# Patient Record
Sex: Female | Born: 1940
Health system: Southern US, Community
[De-identification: ages and names within clinical notes are randomized; demographics above are authoritative.]

## PROBLEM LIST (undated history)

## (undated) DIAGNOSIS — M1990 Unspecified osteoarthritis, unspecified site: Secondary | ICD-10-CM

## (undated) HISTORY — PX: FOOT SURGERY: SHX648

## (undated) HISTORY — PX: KIDNEY SURGERY: SHX687

## (undated) HISTORY — PX: BLEPHAROPLASTY: SUR158

## (undated) HISTORY — PX: BREAST CYST ASPIRATION: SHX578

---

## 2004-04-17 ENCOUNTER — Ambulatory Visit: Payer: Self-pay | Admitting: Internal Medicine

## 2005-01-21 ENCOUNTER — Ambulatory Visit: Payer: Self-pay | Admitting: Orthopedic Surgery

## 2005-04-18 ENCOUNTER — Ambulatory Visit: Payer: Self-pay | Admitting: Internal Medicine

## 2006-04-29 ENCOUNTER — Ambulatory Visit: Payer: Self-pay | Admitting: Family Medicine

## 2007-05-05 ENCOUNTER — Ambulatory Visit: Payer: Self-pay | Admitting: Family Medicine

## 2008-02-11 ENCOUNTER — Ambulatory Visit: Payer: Self-pay | Admitting: Pulmonary Disease

## 2008-02-11 DIAGNOSIS — R059 Cough, unspecified: Secondary | ICD-10-CM | POA: Insufficient documentation

## 2008-02-11 DIAGNOSIS — R05 Cough: Secondary | ICD-10-CM

## 2008-02-11 DIAGNOSIS — J309 Allergic rhinitis, unspecified: Secondary | ICD-10-CM | POA: Insufficient documentation

## 2008-02-18 ENCOUNTER — Telehealth (INDEPENDENT_AMBULATORY_CARE_PROVIDER_SITE_OTHER): Payer: Self-pay | Admitting: *Deleted

## 2008-05-08 ENCOUNTER — Ambulatory Visit: Payer: Self-pay | Admitting: Family Medicine

## 2009-05-15 ENCOUNTER — Ambulatory Visit: Payer: Self-pay | Admitting: Family Medicine

## 2010-05-16 ENCOUNTER — Ambulatory Visit: Payer: Self-pay | Admitting: Family Medicine

## 2010-10-31 ENCOUNTER — Ambulatory Visit: Payer: Self-pay | Admitting: Gastroenterology

## 2011-05-20 ENCOUNTER — Ambulatory Visit: Payer: Self-pay | Admitting: Family Medicine

## 2011-08-13 ENCOUNTER — Ambulatory Visit: Payer: Self-pay | Admitting: Family Medicine

## 2011-12-25 ENCOUNTER — Emergency Department: Payer: Self-pay | Admitting: Emergency Medicine

## 2011-12-25 LAB — COMPREHENSIVE METABOLIC PANEL
Alkaline Phosphatase: 104 U/L (ref 50–136)
BUN: 16 mg/dL (ref 7–18)
Calcium, Total: 9.1 mg/dL (ref 8.5–10.1)
Chloride: 102 mmol/L (ref 98–107)
Co2: 29 mmol/L (ref 21–32)
EGFR (African American): >60
EGFR (Non-African Amer.): >60
Osmolality: 275 (ref 275–301)
Potassium: 3.8 mmol/L (ref 3.5–5.1)
SGPT (ALT): 24 U/L
Sodium: 137 mmol/L (ref 136–145)
Total Protein: 8 g/dL (ref 6.4–8.2)

## 2011-12-25 LAB — URINALYSIS, COMPLETE
Bilirubin,UR: NEGATIVE
Glucose,UR: NEGATIVE mg/dL (ref 0–75)
Ketone: NEGATIVE
Nitrite: NEGATIVE
Ph: 7 (ref 4.5–8.0)
Protein: NEGATIVE
Squamous Epithelial: <1
WBC UR: 2 /HPF (ref 0–5)

## 2011-12-25 LAB — CBC
HGB: 14.6 g/dL (ref 12.0–16.0)
MCH: 30.6 pg (ref 26.0–34.0)
MCHC: 33.2 g/dL (ref 32.0–36.0)
Platelet: 196 10*3/uL (ref 150–440)
WBC: 7.7 10*3/uL (ref 3.6–11.0)

## 2011-12-25 LAB — LIPASE, BLOOD: Lipase: 130 U/L (ref 73–393)

## 2012-04-21 ENCOUNTER — Ambulatory Visit: Payer: Self-pay | Admitting: Podiatry

## 2012-05-20 ENCOUNTER — Ambulatory Visit: Payer: Self-pay | Admitting: Family Medicine

## 2013-05-24 ENCOUNTER — Ambulatory Visit: Payer: Self-pay | Admitting: Family Medicine

## 2013-06-13 ENCOUNTER — Ambulatory Visit: Payer: Self-pay | Admitting: Family Medicine

## 2013-06-16 ENCOUNTER — Ambulatory Visit: Payer: Self-pay | Admitting: Family Medicine

## 2014-05-29 ENCOUNTER — Ambulatory Visit: Payer: Self-pay | Admitting: Family Medicine

## 2014-08-17 DIAGNOSIS — H02402 Unspecified ptosis of left eyelid: Secondary | ICD-10-CM | POA: Diagnosis not present

## 2014-11-03 DIAGNOSIS — H2513 Age-related nuclear cataract, bilateral: Secondary | ICD-10-CM | POA: Diagnosis not present

## 2015-02-19 DIAGNOSIS — E785 Hyperlipidemia, unspecified: Secondary | ICD-10-CM | POA: Diagnosis not present

## 2015-02-19 DIAGNOSIS — Z0000 Encounter for general adult medical examination without abnormal findings: Secondary | ICD-10-CM | POA: Diagnosis not present

## 2015-02-23 ENCOUNTER — Other Ambulatory Visit: Payer: Self-pay | Admitting: Family Medicine

## 2015-02-23 DIAGNOSIS — Z1231 Encounter for screening mammogram for malignant neoplasm of breast: Secondary | ICD-10-CM

## 2015-03-20 DIAGNOSIS — J019 Acute sinusitis, unspecified: Secondary | ICD-10-CM | POA: Diagnosis not present

## 2015-05-31 ENCOUNTER — Other Ambulatory Visit: Payer: Self-pay | Admitting: Family Medicine

## 2015-05-31 ENCOUNTER — Ambulatory Visit
Admission: RE | Admit: 2015-05-31 | Discharge: 2015-05-31 | Disposition: A | Discharge status: Home / Self Care | Payer: Commercial Managed Care - HMO | Source: Ambulatory Visit | Attending: Family Medicine | Admitting: Family Medicine

## 2015-05-31 DIAGNOSIS — Z1231 Encounter for screening mammogram for malignant neoplasm of breast: Secondary | ICD-10-CM | POA: Diagnosis not present

## 2015-06-27 DIAGNOSIS — R05 Cough: Secondary | ICD-10-CM | POA: Diagnosis not present

## 2015-09-06 DIAGNOSIS — H16251 Phlyctenular keratoconjunctivitis, right eye: Secondary | ICD-10-CM | POA: Diagnosis not present

## 2015-09-13 DIAGNOSIS — H11001 Unspecified pterygium of right eye: Secondary | ICD-10-CM | POA: Diagnosis not present

## 2015-11-08 DIAGNOSIS — H2513 Age-related nuclear cataract, bilateral: Secondary | ICD-10-CM | POA: Diagnosis not present

## 2016-02-25 DIAGNOSIS — E785 Hyperlipidemia, unspecified: Secondary | ICD-10-CM | POA: Diagnosis not present

## 2016-02-25 DIAGNOSIS — Z0000 Encounter for general adult medical examination without abnormal findings: Secondary | ICD-10-CM | POA: Diagnosis not present

## 2016-02-25 DIAGNOSIS — M8580 Other specified disorders of bone density and structure, unspecified site: Secondary | ICD-10-CM | POA: Diagnosis not present

## 2016-02-26 ENCOUNTER — Other Ambulatory Visit: Payer: Self-pay | Admitting: Family Medicine

## 2016-02-26 DIAGNOSIS — Z1231 Encounter for screening mammogram for malignant neoplasm of breast: Secondary | ICD-10-CM

## 2016-06-05 ENCOUNTER — Ambulatory Visit
Admission: RE | Admit: 2016-06-05 | Discharge: 2016-06-05 | Disposition: A | Discharge status: Home / Self Care | Payer: Commercial Managed Care - HMO | Source: Ambulatory Visit | Attending: Family Medicine | Admitting: Family Medicine

## 2016-06-05 DIAGNOSIS — Z1231 Encounter for screening mammogram for malignant neoplasm of breast: Secondary | ICD-10-CM | POA: Diagnosis not present

## 2016-06-10 DIAGNOSIS — M79671 Pain in right foot: Secondary | ICD-10-CM | POA: Diagnosis not present

## 2016-11-18 DIAGNOSIS — H2513 Age-related nuclear cataract, bilateral: Secondary | ICD-10-CM | POA: Diagnosis not present

## 2017-02-26 DIAGNOSIS — E785 Hyperlipidemia, unspecified: Secondary | ICD-10-CM | POA: Diagnosis not present

## 2017-02-26 DIAGNOSIS — Z0000 Encounter for general adult medical examination without abnormal findings: Secondary | ICD-10-CM | POA: Diagnosis not present

## 2017-02-26 DIAGNOSIS — M2060 Acquired deformities of toe(s), unspecified, unspecified foot: Secondary | ICD-10-CM | POA: Diagnosis not present

## 2017-03-10 DIAGNOSIS — M79671 Pain in right foot: Secondary | ICD-10-CM | POA: Diagnosis not present

## 2017-03-10 DIAGNOSIS — M19071 Primary osteoarthritis, right ankle and foot: Secondary | ICD-10-CM | POA: Diagnosis not present

## 2017-04-21 ENCOUNTER — Other Ambulatory Visit: Payer: Self-pay | Admitting: Family Medicine

## 2017-04-21 DIAGNOSIS — Z1231 Encounter for screening mammogram for malignant neoplasm of breast: Secondary | ICD-10-CM

## 2017-05-05 ENCOUNTER — Ambulatory Visit
Admission: RE | Admit: 2017-05-05 | Discharge: 2017-05-05 | Disposition: A | Discharge status: Home / Self Care | Payer: Commercial Managed Care - HMO | Source: Ambulatory Visit | Attending: Family Medicine | Admitting: Family Medicine

## 2017-05-05 DIAGNOSIS — Z1231 Encounter for screening mammogram for malignant neoplasm of breast: Secondary | ICD-10-CM

## 2017-07-01 ENCOUNTER — Ambulatory Visit
Admission: RE | Admit: 2017-07-01 | Discharge: 2017-07-01 | Disposition: A | Discharge status: Home / Self Care | Payer: Commercial Managed Care - HMO | Source: Ambulatory Visit | Attending: Family Medicine | Admitting: Family Medicine

## 2017-07-01 DIAGNOSIS — Z1231 Encounter for screening mammogram for malignant neoplasm of breast: Secondary | ICD-10-CM | POA: Insufficient documentation

## 2017-12-09 DIAGNOSIS — H2513 Age-related nuclear cataract, bilateral: Secondary | ICD-10-CM | POA: Diagnosis not present

## 2018-03-08 DIAGNOSIS — R208 Other disturbances of skin sensation: Secondary | ICD-10-CM | POA: Diagnosis not present

## 2018-03-08 DIAGNOSIS — M8580 Other specified disorders of bone density and structure, unspecified site: Secondary | ICD-10-CM | POA: Diagnosis not present

## 2018-03-08 DIAGNOSIS — Z0000 Encounter for general adult medical examination without abnormal findings: Secondary | ICD-10-CM | POA: Diagnosis not present

## 2018-03-08 DIAGNOSIS — E785 Hyperlipidemia, unspecified: Secondary | ICD-10-CM | POA: Diagnosis not present

## 2018-03-08 DIAGNOSIS — Z23 Encounter for immunization: Secondary | ICD-10-CM | POA: Diagnosis not present

## 2018-03-12 ENCOUNTER — Other Ambulatory Visit: Payer: Self-pay | Admitting: Family Medicine

## 2018-03-12 DIAGNOSIS — Z1231 Encounter for screening mammogram for malignant neoplasm of breast: Secondary | ICD-10-CM

## 2018-03-31 DIAGNOSIS — M810 Age-related osteoporosis without current pathological fracture: Secondary | ICD-10-CM | POA: Diagnosis not present

## 2018-04-08 DIAGNOSIS — M79671 Pain in right foot: Secondary | ICD-10-CM | POA: Diagnosis not present

## 2018-04-08 DIAGNOSIS — M79604 Pain in right leg: Secondary | ICD-10-CM | POA: Diagnosis not present

## 2018-04-08 DIAGNOSIS — M79672 Pain in left foot: Secondary | ICD-10-CM | POA: Diagnosis not present

## 2018-04-08 DIAGNOSIS — M79605 Pain in left leg: Secondary | ICD-10-CM | POA: Diagnosis not present

## 2018-07-05 ENCOUNTER — Ambulatory Visit
Admission: RE | Admit: 2018-07-05 | Discharge: 2018-07-05 | Disposition: A | Discharge status: Home / Self Care | Payer: Medicare HMO | Source: Ambulatory Visit | Attending: Family Medicine | Admitting: Family Medicine

## 2018-07-05 DIAGNOSIS — Z1231 Encounter for screening mammogram for malignant neoplasm of breast: Secondary | ICD-10-CM | POA: Insufficient documentation

## 2018-12-24 ENCOUNTER — Emergency Department: Payer: Medicare HMO

## 2018-12-24 ENCOUNTER — Emergency Department
Admission: EM | Admit: 2018-12-24 | Discharge: 2018-12-24 | Disposition: A | Discharge status: Home / Self Care | Payer: Medicare HMO | Attending: Emergency Medicine | Admitting: Emergency Medicine

## 2018-12-24 ENCOUNTER — Encounter: Payer: Self-pay | Admitting: Emergency Medicine

## 2018-12-24 ENCOUNTER — Other Ambulatory Visit: Payer: Self-pay

## 2018-12-24 DIAGNOSIS — Z5321 Procedure and treatment not carried out due to patient leaving prior to being seen by health care provider: Secondary | ICD-10-CM | POA: Insufficient documentation

## 2018-12-24 DIAGNOSIS — Y999 Unspecified external cause status: Secondary | ICD-10-CM | POA: Insufficient documentation

## 2018-12-24 DIAGNOSIS — Y92488 Other paved roadways as the place of occurrence of the external cause: Secondary | ICD-10-CM | POA: Insufficient documentation

## 2018-12-24 DIAGNOSIS — Y939 Activity, unspecified: Secondary | ICD-10-CM | POA: Insufficient documentation

## 2018-12-24 DIAGNOSIS — S0990XA Unspecified injury of head, initial encounter: Secondary | ICD-10-CM | POA: Insufficient documentation

## 2018-12-24 DIAGNOSIS — W010XXA Fall on same level from slipping, tripping and stumbling without subsequent striking against object, initial encounter: Secondary | ICD-10-CM | POA: Insufficient documentation

## 2018-12-24 NOTE — ED Triage Notes (Signed)
Pt to ER states she tripped and fell onto concrete last night.  PT hit head, denies LOC, dizziness, or other neuro symptoms.  PT with noted hematoma to bilateral eyes, states she feel like this is getting worse.

## 2018-12-26 ENCOUNTER — Emergency Department: Payer: Medicare HMO

## 2018-12-26 ENCOUNTER — Emergency Department
Admission: EM | Admit: 2018-12-26 | Discharge: 2018-12-26 | Disposition: A | Discharge status: Home / Self Care | Payer: Medicare HMO | Attending: Student in an Organized Health Care Education/Training Program | Admitting: Student in an Organized Health Care Education/Training Program

## 2018-12-26 ENCOUNTER — Other Ambulatory Visit: Payer: Self-pay

## 2018-12-26 DIAGNOSIS — M25531 Pain in right wrist: Secondary | ICD-10-CM | POA: Diagnosis not present

## 2018-12-26 DIAGNOSIS — W01198A Fall on same level from slipping, tripping and stumbling with subsequent striking against other object, initial encounter: Secondary | ICD-10-CM | POA: Diagnosis not present

## 2018-12-26 DIAGNOSIS — S0083XA Contusion of other part of head, initial encounter: Secondary | ICD-10-CM | POA: Diagnosis not present

## 2018-12-26 DIAGNOSIS — M79675 Pain in left toe(s): Secondary | ICD-10-CM | POA: Diagnosis not present

## 2018-12-26 DIAGNOSIS — S92402A Displaced unspecified fracture of left great toe, initial encounter for closed fracture: Secondary | ICD-10-CM

## 2018-12-26 DIAGNOSIS — S6992XA Unspecified injury of left wrist, hand and finger(s), initial encounter: Secondary | ICD-10-CM | POA: Diagnosis not present

## 2018-12-26 DIAGNOSIS — S92422A Displaced fracture of distal phalanx of left great toe, initial encounter for closed fracture: Secondary | ICD-10-CM | POA: Diagnosis not present

## 2018-12-26 DIAGNOSIS — Y929 Unspecified place or not applicable: Secondary | ICD-10-CM | POA: Insufficient documentation

## 2018-12-26 DIAGNOSIS — Y93K9 Activity, other involving animal care: Secondary | ICD-10-CM | POA: Diagnosis not present

## 2018-12-26 DIAGNOSIS — M47812 Spondylosis without myelopathy or radiculopathy, cervical region: Secondary | ICD-10-CM | POA: Diagnosis not present

## 2018-12-26 DIAGNOSIS — M25532 Pain in left wrist: Secondary | ICD-10-CM | POA: Diagnosis not present

## 2018-12-26 DIAGNOSIS — S0990XA Unspecified injury of head, initial encounter: Secondary | ICD-10-CM | POA: Diagnosis present

## 2018-12-26 DIAGNOSIS — Y998 Other external cause status: Secondary | ICD-10-CM | POA: Diagnosis not present

## 2018-12-26 DIAGNOSIS — M4802 Spinal stenosis, cervical region: Secondary | ICD-10-CM | POA: Diagnosis not present

## 2018-12-26 NOTE — ED Notes (Signed)

## 2018-12-26 NOTE — ED Provider Notes (Signed)
[facility] Emergency Department Provider Note  ____________________________________________  Time seen: Approximately 4:07 PM  I have reviewed the triage vital signs and the nursing notes.   HISTORY  Chief Complaint Fall    HPI Sheila Rojas Sheila Rojas is a 78 y.o. female presents emergency department for evaluation of head injury 3 days ago.  Patient was feeding the dear 3 days ago when her flip-flop got caught and she fell landing on her face.  She did not lose consciousness.  She came to the emergency department 2 days ago, had a head CT completed, which was negative for acute abnormalities but left before evaluation.  She came back to the emergency department today to see what her results were.  She has not had any headaches, dizziness, confusion since fall.  She has a "knot "to her forehead.  She also has a small abrasion that she has been applying peroxide to.  She has bruising to bilateral eyes, bilateral wrists, left great toe.  She has not taken anything for pain because she not has not had any pain.  Overall she feels well.  She does not take any blood thinners.  No past medical history on file.  Patient Active Problem List   Diagnosis Date Noted  . ALLERGIC RHINITIS 02/11/2008  . COUGH 02/11/2008    Past Surgical History:  Procedure Laterality Date  . BREAST CYST ASPIRATION Left     Prior to Admission medications   Not on File    Allergies Codeine, Sulfonamide derivatives, Ibuprofen, and Other  Family History  Problem Relation Age of Onset  . Breast cancer Sister        half sister    Social History Social History   Tobacco Use  . Smoking status: Never Smoker  . Smokeless tobacco: Never Used  Substance Use Topics  . Alcohol use: Not on file  . Drug use: Not on file     Review of Systems  Cardiovascular: No chest pain. Respiratory: NNo SOB. Gastrointestinal: No abdominal pain.  No nausea, no vomiting.  Musculoskeletal: Negative  for musculoskeletal pain. Skin: Negative for rash, abrasions, lacerations. Positive for ecchymosis ecchymosis. Neurological: Negative for headaches   ____________________________________________   PHYSICAL EXAM:  VITAL SIGNS: ED Triage Vitals  Enc Vitals Group     BP 12/26/18 1302 (!) 164/84     Pulse Rate 12/26/18 1302 67     Resp 12/26/18 1302 18     Temp 12/26/18 1302 99.1 F (37.3 C)     Temp Source 12/26/18 1302 Oral     SpO2 12/26/18 1302 97 %     Weight 12/26/18 1303 160 lb (72.6 kg)     Height 12/26/18 1303 5\' 1"  (1.549 m)     Head Circumference --      Peak Flow --      Pain Score 12/26/18 1303 0     Pain Loc --      Pain Edu? --      Excl. in [place]? --      Constitutional: Alert and oriented. Well appearing and in no acute distress.  Cheerful. Eyes: Small cubconjunctival hemorrhage to right lateral conjunctiva. PERRL. EOMI. Head: 2 cm x 2 cm area of swelling to right forehead with overlying abrasion.  Significant cchymosis to bilateral eyes. ENT:      Ears:      Nose: No congestion/rhinnorhea.      Mouth/Throat: Mucous membranes are moist.  Neck: No stridor. No cervical spine tenderness to  palpation. Cardiovascular: Normal rate, regular rhythm.  Good peripheral circulation. Respiratory: Normal respiratory effort without tachypnea or retractions. Lungs CTAB. Good air entry to the bases with no decreased or absent breath sounds. Musculoskeletal: Full range of motion to all extremities. No gross deformities appreciated. Neurologic:  Normal speech and language. No gross focal neurologic deficits are appreciated.  Skin:  Skin is warm, dry.  Minimal bruising to bilateral volar wrists.  Mild bruising to left great toe. Psychiatric: Mood and affect are normal. Speech and behavior are normal. Patient exhibits appropriate insight and judgement.   ____________________________________________   LABS (all labs ordered are listed, but only abnormal results are  displayed)  Labs Reviewed - No data to display ____________________________________________  EKG   ____________________________________________  RADIOLOGY Robinette Haines, personally viewed and evaluated these images (plain radiographs) as part of my medical decision making, as well as reviewing the written report by the radiologist.  Dg Wrist Complete Left  Result Date: 12/26/2018 CLINICAL DATA:  Left wrist pain secondary to a fall 3 days ago. EXAM: LEFT WRIST - COMPLETE 3+ VIEW COMPARISON:  None. FINDINGS: There is no fracture or dislocation. Moderate arthritis at the first [facility] joint. IMPRESSION: No acute abnormalities. Electronically Signed   By: Lorriane Shire M.D.   On: 12/26/2018 15:28   Dg Wrist Complete Right  Result Date: 12/26/2018 CLINICAL DATA:  Right wrist pain secondary to a fall 3 days ago. EXAM: RIGHT WRIST - COMPLETE 3+ VIEW COMPARISON:  None. FINDINGS: No fracture or dislocation. Severe arthritic changes at the first [facility] joint. Moderate arthritis between the scaphoid and trapezium and trapezoid. Slight arthritis of the IP joint of the thumb. IMPRESSION: No acute abnormality.  Arthritic changes as described. Electronically Signed   By: Lorriane Shire M.D.   On: 12/26/2018 15:27   Ct Cervical Spine Wo Contrast  Result Date: 12/26/2018 CLINICAL DATA:  Facial trauma secondary to a fall. EXAM: CT CERVICAL SPINE WITHOUT CONTRAST TECHNIQUE: Multidetector CT imaging of the cervical spine was performed without intravenous contrast. Multiplanar CT image reconstructions were also generated. COMPARISON:  None. FINDINGS: Alignment: 1.7 mm anterolisthesis of C3 on C4. 1.4 mm anterolisthesis of C4 on C5. Skull base and vertebrae: No fractures bone destruction. Moderate severe right facet arthritis at C2-3 and C3-4 and C6-7. Moderate facet arthritis at C3-4 and C4-5 on the left. Soft tissues and spinal canal: No prevertebral fluid or swelling. No visible canal hematoma. Disc levels: C2-3:  No disc bulging or protrusion. Severe right facet arthritis with slight right foraminal stenosis. C3-4: Tiny central disc bulge with no neural impingement. Severe right and moderate left facet arthritis. Moderate bilateral foraminal stenosis. C4-5: No disc bulging or protrusion. Moderate left foraminal stenosis. Moderate left facet arthritis. C5-6: Disc space narrowing. Prominent osteophytes to the right of midline extending into the right lateral recess. No soft disc protrusion. No foraminal stenosis. C6-7: Chronic disc space narrowing. Small in place osteophytes just to the left of midline. No foraminal stenosis. C7-T1: Severe right facet arthritis. No foraminal stenosis. No disc bulging or protrusion. Upper chest: Negative. Other: None IMPRESSION: 1. No acute abnormality of the cervical spine. 2. Multilevel degenerative disc and joint disease as described. 3. No acute disc protrusions or acute neural impingement. 4. Multilevel foraminal stenoses as described above. Electronically Signed   By: Lorriane Shire M.D.   On: 12/26/2018 15:36   Dg Foot Complete Left  Result Date: 12/26/2018 CLINICAL DATA:  Left great toe pain with bruising and tenderness since a  fall 3 days ago. EXAM: LEFT FOOT - COMPLETE 3+ VIEW COMPARISON:  None. FINDINGS: There is an avulsion fracture of the dorsal aspect of the base of the distal phalanx of the great toe. No other acute bony abnormality. Previous surgery of the distal first metatarsal with bunionectomy. Surgical screw remains. Slight arthritis at the first MTP joint. Slight arthritic changes of the IP joints of multiple toes. Small plantar calcaneal enthesophyte. IMPRESSION: Acute avulsion fracture of the dorsal aspect of the distal phalanx of the great toe at the IP joint. Electronically Signed   By: Lorriane Shire M.D.   On: 12/26/2018 15:26   Ct Maxillofacial Wo Contrast  Result Date: 12/26/2018 CLINICAL DATA:  Trip and fall, bruising to face EXAM: CT MAXILLOFACIAL WITHOUT  CONTRAST TECHNIQUE: Multidetector CT imaging of the maxillofacial structures was performed. Multiplanar CT image reconstructions were also generated. COMPARISON:  None. FINDINGS: Osseous: No fracture or mandibular dislocation. No destructive process. Orbits: Negative. No traumatic or inflammatory finding. Sinuses: Clear. Incidental note of non pneumatized variant left frontal sinus. Soft tissues: Soft tissue hematoma of the right forehead. Soft tissue contusions of the cheeks. Limited intracranial: No significant or unexpected finding. IMPRESSION: No fracture or dislocation of the facial bones. Soft tissue hematoma of the right forehead. Soft tissue contusions of the cheeks. Electronically Signed   By: Eddie Candle M.D.   On: 12/26/2018 15:30    ____________________________________________    PROCEDURES  Procedure(s) performed:    Procedures    Medications - No data to display   ____________________________________________   INITIAL IMPRESSION / ASSESSMENT AND PLAN / ED COURSE  Pertinent labs & imaging results that were available during my care of the patient were reviewed by me and considered in my medical decision making (see chart for details).  Review of the [place] CSRS was performed in accordance of the [place] prior to dispensing any controlled drugs.    Patient presented to emergency department for evaluation after fall 3 days ago.  Vital signs and exam are reassuring.  Head CT completed 2 days ago is negative for acute abnormalities.  Patient denies any headache, dizziness, confusion.  Maxillofacial CT and cervical CT today are negative for fracture or acute bony abnormalities.  Maxillofacial CT consistent with forehead soft tissue hematoma and bilateral cheek contusions.  Patient is not on any blood thinners.  Left great toe x-ray consistent with small avulsion fracture.  Toes were buddy taped.  Patient denies any pain currently.  Patient is to follow up with primary care as  directed. Patient is given ED precautions to return to the ED for any worsening or new symptoms.  Antinette Keough was evaluated in Emergency Department on 12/26/2018 for the symptoms described in the history of present illness. She was evaluated in the context of the global COVID-19 pandemic, which necessitated consideration that the patient might be at risk for infection with the SARS-CoV-2 virus that causes COVID-19. Institutional protocols and algorithms that pertain to the evaluation of patients at risk for COVID-19 are in a state of rapid change based on information released by regulatory bodies including the CDC and federal and state organizations. These policies and algorithms were followed during the patient's care in the ED.    ____________________________________________  FINAL CLINICAL IMPRESSION(S) / ED DIAGNOSES  Final diagnoses:  Contusion of face, initial encounter  Closed displaced fracture of phalanx of left great toe, unspecified phalanx, initial encounter      NEW MEDICATIONS STARTED DURING THIS VISIT:  ED  Discharge Orders    None          This chart was dictated using voice recognition software/Dragon. Despite best efforts to proofread, errors can occur which can change the meaning. Any change was purely unintentional.    Laban Emperor, PA-C 12/26/18 1625    Merlyn Lot, MD 12/27/18 [phone]

## 2018-12-26 NOTE — Discharge Instructions (Addendum)
Your head CT, facial CT, neck CT did not show anything that is broken.  You have a small break to your toe.  Please call your family doctor in the morning for an appointment early this week.

## 2018-12-26 NOTE — ED Triage Notes (Signed)
Pt states that she was here on the 26th, but didn't stay to be seen due to her ride needing to leave. Pt states that her flip flops caused her to trip and she fell forward hitting her face on the pavement, pt denies loc. Pt has bruising to her face and forehead and bilat wrists, pt denies pain and denies taking blood thinners. Pt states that she is here to find out the results of her x-ray

## 2018-12-29 DIAGNOSIS — S0083XD Contusion of other part of head, subsequent encounter: Secondary | ICD-10-CM | POA: Diagnosis not present

## 2019-01-25 DIAGNOSIS — H2513 Age-related nuclear cataract, bilateral: Secondary | ICD-10-CM | POA: Diagnosis not present

## 2019-04-12 DIAGNOSIS — R208 Other disturbances of skin sensation: Secondary | ICD-10-CM | POA: Diagnosis not present

## 2019-06-01 ENCOUNTER — Other Ambulatory Visit: Payer: Self-pay | Admitting: Family Medicine

## 2019-06-01 DIAGNOSIS — Z1231 Encounter for screening mammogram for malignant neoplasm of breast: Secondary | ICD-10-CM

## 2019-06-02 DIAGNOSIS — E785 Hyperlipidemia, unspecified: Secondary | ICD-10-CM | POA: Diagnosis not present

## 2019-06-02 DIAGNOSIS — E538 Deficiency of other specified B group vitamins: Secondary | ICD-10-CM | POA: Diagnosis not present

## 2019-06-02 DIAGNOSIS — Z0000 Encounter for general adult medical examination without abnormal findings: Secondary | ICD-10-CM | POA: Diagnosis not present

## 2019-06-02 DIAGNOSIS — R202 Paresthesia of skin: Secondary | ICD-10-CM | POA: Diagnosis not present

## 2019-06-09 DIAGNOSIS — H02402 Unspecified ptosis of left eyelid: Secondary | ICD-10-CM | POA: Diagnosis not present

## 2019-06-09 DIAGNOSIS — H02403 Unspecified ptosis of bilateral eyelids: Secondary | ICD-10-CM | POA: Diagnosis not present

## 2019-06-09 DIAGNOSIS — H02411 Mechanical ptosis of right eyelid: Secondary | ICD-10-CM | POA: Diagnosis not present

## 2019-06-09 DIAGNOSIS — H02401 Unspecified ptosis of right eyelid: Secondary | ICD-10-CM | POA: Diagnosis not present

## 2019-06-09 DIAGNOSIS — H02412 Mechanical ptosis of left eyelid: Secondary | ICD-10-CM | POA: Diagnosis not present

## 2019-06-09 DIAGNOSIS — E538 Deficiency of other specified B group vitamins: Secondary | ICD-10-CM | POA: Diagnosis not present

## 2019-06-16 DIAGNOSIS — E538 Deficiency of other specified B group vitamins: Secondary | ICD-10-CM | POA: Diagnosis not present

## 2019-06-27 DIAGNOSIS — E538 Deficiency of other specified B group vitamins: Secondary | ICD-10-CM | POA: Diagnosis not present

## 2019-07-05 DIAGNOSIS — E538 Deficiency of other specified B group vitamins: Secondary | ICD-10-CM | POA: Diagnosis not present

## 2019-07-19 ENCOUNTER — Ambulatory Visit
Admission: RE | Admit: 2019-07-19 | Discharge: 2019-07-19 | Disposition: A | Discharge status: Home / Self Care | Payer: Medicare HMO | Source: Ambulatory Visit | Attending: Family Medicine | Admitting: Family Medicine

## 2019-07-19 DIAGNOSIS — Z1231 Encounter for screening mammogram for malignant neoplasm of breast: Secondary | ICD-10-CM | POA: Diagnosis not present

## 2019-08-04 DIAGNOSIS — L574 Cutis laxa senilis: Secondary | ICD-10-CM | POA: Diagnosis not present

## 2019-08-08 DIAGNOSIS — E538 Deficiency of other specified B group vitamins: Secondary | ICD-10-CM | POA: Diagnosis not present

## 2019-09-06 DIAGNOSIS — E538 Deficiency of other specified B group vitamins: Secondary | ICD-10-CM | POA: Diagnosis not present

## 2019-10-11 DIAGNOSIS — E538 Deficiency of other specified B group vitamins: Secondary | ICD-10-CM | POA: Diagnosis not present

## 2019-10-13 DIAGNOSIS — R202 Paresthesia of skin: Secondary | ICD-10-CM | POA: Diagnosis not present

## 2019-10-13 DIAGNOSIS — R208 Other disturbances of skin sensation: Secondary | ICD-10-CM | POA: Diagnosis not present

## 2019-11-10 ENCOUNTER — Other Ambulatory Visit: Payer: Self-pay

## 2019-11-10 ENCOUNTER — Encounter: Payer: Self-pay | Admitting: Ophthalmology

## 2019-11-10 DIAGNOSIS — E538 Deficiency of other specified B group vitamins: Secondary | ICD-10-CM | POA: Diagnosis not present

## 2019-11-16 ENCOUNTER — Other Ambulatory Visit
Admission: RE | Admit: 2019-11-16 | Discharge: 2019-11-16 | Disposition: A | Discharge status: Home / Self Care | Payer: Medicare HMO | Source: Ambulatory Visit | Attending: Ophthalmology | Admitting: Ophthalmology

## 2019-11-16 ENCOUNTER — Other Ambulatory Visit: Payer: Self-pay

## 2019-11-16 DIAGNOSIS — Z01812 Encounter for preprocedural laboratory examination: Secondary | ICD-10-CM | POA: Insufficient documentation

## 2019-11-16 DIAGNOSIS — Z20822 Contact with and (suspected) exposure to covid-19: Secondary | ICD-10-CM | POA: Diagnosis not present

## 2019-11-16 NOTE — Discharge Instructions (Signed)
INSTRUCTIONS FOLLOWING OCULOPLASTIC SURGERY AMY M. FOWLER, MD  AFTER YOUR EYE SURGERY, THER ARE MANY THINGS WHICH YOU, THE PATIENT, CAN DO TO ASSURE THE BEST POSSIBLE RESULT FROM YOUR OPERATION.  THIS SHEET SHOULD BE REFERRED TO WHENEVER QUESTIONS ARISE.  IF THERE ARE ANY QUESTIONS NOT ANSWERED HERE, DO NOT HESITATE TO CALL OUR OFFICE AT [phone] OR [phone].  THERE IS ALWAYS SOMEONE AVAILABLE TO CALL IF QUESTIONS OR PROBLEMS ARISE.  VISION: Your vision may be blurred and out of focus after surgery until you are able to stop using your ointment, swelling resolves and your eye(s) heal. This may take 1 to 2 weeks at the least.  If your vision becomes gradually more dim or dark, this is not normal and you need to call our office immediately.  EYE CARE: For the first 48 hours after surgery, use ice packs frequently - "20 minutes on, 20 minutes off" - to help reduce swelling and bruising.  Small bags of frozen peas or corn make good ice packs along with cloths soaked in ice water.  If you are wearing a patch or other type of dressing following surgery, keep this on for the amount of time specified by your doctor.  For the first week following surgery, you will need to treat your stitches with great care.  It is OK to shower, but take care to not allow soapy water to run into your eye(s) to help reduce chances of infection.  You may gently clean the eyelashes and around the eye(s) with cotton balls and sterile water, BUT DO NOT RUB THE STITCHES VIGOROUSLY.  Keeping your stitches moist with ointment will help promote healing with minimal scar formation.  ACTIVITY: When you leave the surgery center, you should go home, rest and be inactive.  The eye(s) may feel scratchy and keeping the eyes closed will allow for faster healing.  The first week following surgery, avoid straining (anything making the face turn red) or lifting over 20 pounds.  Additionally, avoid bending which causes your head to go below  your waist.  Using your eyes will NOT harm them, so feel free to read, watch television, use the computer, etc as desired.  Driving depends on each individual, so check with your doctor if you have questions about driving. Do not wear contact lenses for about 2 weeks.  Do not wear eye makeup for 2 weeks.  Avoid swimming, hot tubs, gardening, and dusting for 1 to 2 weeks to reduce the risk of an infection.  MEDICATIONS:  You will be given a prescription for an ointment to use 4 times a day on your stitches.  You can use the ointment in your eyes if they feel scratchy or irritated.  If you eyelid(s) don't close completely when you sleep, put some ointment in your eyes before bedtime.  EMERGENCY: If you experience SEVERE EYE PAIN OR HEADACHE UNRELIEVED BY TYLENOL OR TRAMADOL, NAUSEA OR VOMITING, WORSENING REDNESS, OR WORSENING VISION (ESPECIALLY VISION THAT WAS INITIALLY BETTER) CALL [phone] OR [phone] DURING BUSINESS HOURS OR AFTER HOURS.  General Anesthesia, Adult, Care After This sheet gives you information about how to care for yourself after your procedure. Your health care provider may also give you more specific instructions. If you have problems or questions, contact your health care provider. What can I expect after the procedure? After the procedure, the following side effects are common:  Pain or discomfort at the IV site.  Nausea.  Vomiting.  Sore throat.  Trouble concentrating.    Feeling cold or chills.  Weak or tired.  Sleepiness and fatigue.  Soreness and body aches. These side effects can affect parts of the body that were not involved in surgery. Follow these instructions at home:  For at least 24 hours after the procedure:  Have a responsible adult stay with you. It is important to have someone help care for you until you are awake and alert.  Rest as needed.  Do not: ? Participate in activities in which you could fall or become injured. ? Drive. ? Use  heavy machinery. ? Drink alcohol. ? Take sleeping pills or medicines that cause drowsiness. ? Make important decisions or sign legal documents. ? Take care of children on your own. Eating and drinking  Follow any instructions from your health care provider about eating or drinking restrictions.  When you feel hungry, start by eating small amounts of foods that are soft and easy to digest (bland), such as toast. Gradually return to your regular diet.  Drink enough fluid to keep your urine pale yellow.  If you vomit, rehydrate by drinking water, juice, or clear broth. General instructions  If you have sleep apnea, surgery and certain medicines can increase your risk for breathing problems. Follow instructions from your health care provider about wearing your sleep device: ? Anytime you are sleeping, including during daytime naps. ? While taking prescription pain medicines, sleeping medicines, or medicines that make you drowsy.  Return to your normal activities as told by your health care provider. Ask your health care provider what activities are safe for you.  Take over-the-counter and prescription medicines only as told by your health care provider.  If you smoke, do not smoke without supervision.  Keep all follow-up visits as told by your health care provider. This is important. Contact a health care provider if:  You have nausea or vomiting that does not get better with medicine.  You cannot eat or drink without vomiting.  You have pain that does not get better with medicine.  You are unable to pass urine.  You develop a skin rash.  You have a fever.  You have redness around your IV site that gets worse. Get help right away if:  You have difficulty breathing.  You have chest pain.  You have blood in your urine or stool, or you vomit blood. Summary  After the procedure, it is common to have a sore throat or nausea. It is also common to feel tired.  Have a  responsible adult stay with you for the first 24 hours after general anesthesia. It is important to have someone help care for you until you are awake and alert.  When you feel hungry, start by eating small amounts of foods that are soft and easy to digest (bland), such as toast. Gradually return to your regular diet.  Drink enough fluid to keep your urine pale yellow.  Return to your normal activities as told by your health care provider. Ask your health care provider what activities are safe for you. This information is not intended to replace advice given to you by your health care provider. Make sure you discuss any questions you have with your health care provider. Document Revised: 06/19/2017 Document Reviewed: 01/30/2017 Elsevier Patient Education  2020 [employer]vier Inc.  

## 2019-11-17 LAB — SARS CORONAVIRUS 2 (TAT 6-24 HRS): SARS Coronavirus 2: NEGATIVE

## 2019-11-18 ENCOUNTER — Ambulatory Visit: Payer: Medicare HMO | Admitting: Anesthesiology

## 2019-11-18 ENCOUNTER — Encounter
Admission: RE | Disposition: A | Discharge status: Home / Self Care | Payer: Self-pay | Source: Home / Self Care | Attending: Ophthalmology

## 2019-11-18 ENCOUNTER — Ambulatory Visit
Admission: RE | Admit: 2019-11-18 | Discharge: 2019-11-18 | Disposition: A | Discharge status: Home / Self Care | Payer: Medicare HMO | Attending: Ophthalmology | Admitting: Ophthalmology

## 2019-11-18 ENCOUNTER — Encounter: Payer: Self-pay | Admitting: Ophthalmology

## 2019-11-18 ENCOUNTER — Other Ambulatory Visit: Payer: Self-pay

## 2019-11-18 DIAGNOSIS — Z882 Allergy status to sulfonamides status: Secondary | ICD-10-CM | POA: Insufficient documentation

## 2019-11-18 DIAGNOSIS — Z91013 Allergy to seafood: Secondary | ICD-10-CM | POA: Insufficient documentation

## 2019-11-18 DIAGNOSIS — Z91010 Allergy to peanuts: Secondary | ICD-10-CM | POA: Insufficient documentation

## 2019-11-18 DIAGNOSIS — H02403 Unspecified ptosis of bilateral eyelids: Secondary | ICD-10-CM | POA: Diagnosis not present

## 2019-11-18 DIAGNOSIS — Z888 Allergy status to other drugs, medicaments and biological substances status: Secondary | ICD-10-CM | POA: Insufficient documentation

## 2019-11-18 DIAGNOSIS — E7800 Pure hypercholesterolemia, unspecified: Secondary | ICD-10-CM | POA: Diagnosis not present

## 2019-11-18 DIAGNOSIS — M1990 Unspecified osteoarthritis, unspecified site: Secondary | ICD-10-CM | POA: Diagnosis not present

## 2019-11-18 DIAGNOSIS — Z6828 Body mass index (BMI) 28.0-28.9, adult: Secondary | ICD-10-CM | POA: Insufficient documentation

## 2019-11-18 DIAGNOSIS — L574 Cutis laxa senilis: Secondary | ICD-10-CM | POA: Diagnosis not present

## 2019-11-18 DIAGNOSIS — Z885 Allergy status to narcotic agent status: Secondary | ICD-10-CM | POA: Diagnosis not present

## 2019-11-18 DIAGNOSIS — H57813 Brow ptosis, bilateral: Secondary | ICD-10-CM | POA: Insufficient documentation

## 2019-11-18 HISTORY — PX: PTOSIS REPAIR: SHX6568

## 2019-11-18 HISTORY — DX: Unspecified osteoarthritis, unspecified site: M19.90

## 2019-11-18 SURGERY — REPAIR, BLEPHAROPTOSIS
Anesthesia: Monitor Anesthesia Care | Site: Eye | Laterality: Bilateral

## 2019-11-18 MED ORDER — ERYTHROMYCIN 5 MG/GM OP OINT
TOPICAL_OINTMENT | OPHTHALMIC | Status: DC | PRN
  Administered 2019-11-18: 1

## 2019-11-18 MED ORDER — LACTATED RINGERS IV SOLN: INTRAVENOUS | Status: DC

## 2019-11-18 MED ORDER — MIDAZOLAM HCL 2 MG/2ML IJ SOLN
INTRAMUSCULAR | Status: DC | PRN
  Administered 2019-11-18: 1 mg via INTRAVENOUS

## 2019-11-18 MED ORDER — LIDOCAINE-EPINEPHRINE 2 %-1:100000 IJ SOLN
INTRAMUSCULAR | Status: DC | PRN
  Administered 2019-11-18: 3 mL via OPHTHALMIC

## 2019-11-18 MED ORDER — BSS IO SOLN
INTRAOCULAR | Status: DC | PRN
  Administered 2019-11-18: 15 mL

## 2019-11-18 MED ORDER — PROPOFOL 500 MG/50ML IV EMUL
INTRAVENOUS | Status: DC | PRN
  Administered 2019-11-18: 25 ug/kg/min via INTRAVENOUS

## 2019-11-18 MED ORDER — PROPOFOL 500 MG/50ML IV EMUL: INTRAVENOUS | Status: DC | PRN

## 2019-11-18 MED ORDER — ALFENTANIL 500 MCG/ML IJ INJ
INJECTION | INTRAVENOUS | Status: DC | PRN
  Administered 2019-11-18: 400 ug via INTRAVENOUS
  Administered 2019-11-18: 200 ug via INTRAVENOUS

## 2019-11-18 MED ORDER — TRAMADOL HCL 50 MG PO TABS: ORAL_TABLET | ORAL | 0 refills | Status: DC

## 2019-11-18 MED ORDER — TETRACAINE HCL 0.5 % OP SOLN
OPHTHALMIC | Status: DC | PRN
  Administered 2019-11-18: 2 [drp] via OPHTHALMIC

## 2019-11-18 SURGICAL SUPPLY — 34 items
APPLICATOR COTTON TIP WD 3 STR (MISCELLANEOUS) ×3 IMPLANT
BLADE SURG 15 STRL LF DISP TIS (BLADE) ×1 IMPLANT
BLADE SURG 15 STRL SS (BLADE) ×2
CORD BIP STRL DISP 12FT (MISCELLANEOUS) ×3 IMPLANT
DRAPE HEAD BAR (DRAPES) IMPLANT
GAUZE SPONGE 4X4 12PLY STRL (GAUZE/BANDAGES/DRESSINGS) ×3 IMPLANT
GLOVE SURG LX 7.0 MICRO (GLOVE) ×4
GLOVE SURG LX STRL 7.0 MICRO (GLOVE) ×2 IMPLANT
MARKER SKIN XFINE TIP W/RULER (MISCELLANEOUS) ×3 IMPLANT
NEEDLE FILTER BLUNT 18X 1/2SAF (NEEDLE) ×2
NEEDLE FILTER BLUNT 18X1 1/2 (NEEDLE) ×1 IMPLANT
NEEDLE HYPO 30X.5 LL (NEEDLE) ×6 IMPLANT
PACK ENT CUSTOM (PACKS) ×3 IMPLANT
SOL PREP PVP 2OZ (MISCELLANEOUS) ×3
SOLUTION PREP PVP 2OZ (MISCELLANEOUS) ×1 IMPLANT
SPONGE GAUZE 2X2 8PLY STER LF (GAUZE/BANDAGES/DRESSINGS) ×10
SPONGE GAUZE 2X2 8PLY STRL LF (GAUZE/BANDAGES/DRESSINGS) ×20 IMPLANT
SUT CHROMIC 4-0 (SUTURE)
SUT CHROMIC 4-0 M2 12X2 ARM (SUTURE)
SUT CHROMIC 5 0 P 3 (SUTURE) ×6 IMPLANT
SUT ETHILON 4 0 CL P 3 (SUTURE) IMPLANT
SUT GUT PLAIN 6-0 1X18 ABS (SUTURE) IMPLANT
SUT MERSILENE 4-0 S-2 (SUTURE) IMPLANT
SUT PDS AB 4-0 P3 18 (SUTURE) IMPLANT
SUT PROLENE 5 0 P 3 (SUTURE) ×6 IMPLANT
SUT PROLENE 6 0 P 1 18 (SUTURE) IMPLANT
SUT SILK 4 0 G 3 (SUTURE) IMPLANT
SUT VIC AB 5-0 P-3 18X BRD (SUTURE) IMPLANT
SUT VIC AB 5-0 P3 18 (SUTURE)
SUT VICRYL 7 0 TG140 8 (SUTURE) IMPLANT
SUTURE CHRMC 4-0 M2 12X2 ARM (SUTURE) IMPLANT
SYR 10ML LL (SYRINGE) ×3 IMPLANT
SYR 3ML LL SCALE MARK (SYRINGE) ×3 IMPLANT
WATER STERILE IRR 250ML POUR (IV SOLUTION) ×3 IMPLANT

## 2019-11-18 NOTE — Anesthesia Postprocedure Evaluation (Signed)
Anesthesia Post Note  Patient: Sheila Rojas  Procedure(s) Performed: [place] (Bilateral Eye)     Patient location during evaluation: PACU Anesthesia Type: MAC Level of consciousness: awake and alert Pain management: pain level controlled Vital Signs Assessment: post-procedure vital signs reviewed and stable Respiratory status: spontaneous breathing, nonlabored ventilation, respiratory function stable and patient connected to nasal cannula oxygen Cardiovascular status: stable and blood pressure returned to baseline Postop Assessment: no apparent nausea or vomiting Anesthetic complications: no    Davonn Flanery

## 2019-11-18 NOTE — Anesthesia Procedure Notes (Signed)
Procedure Name: MAC Performed by: Andrej Spagnoli, CRNA Pre-anesthesia Checklist: Patient identified, Emergency Drugs available, Suction available, Timeout performed and Patient being monitored Patient Re-evaluated:Patient Re-evaluated prior to induction Oxygen Delivery Method: Nasal cannula Placement Confirmation: positive ETCO2       

## 2019-11-18 NOTE — Transfer of Care (Signed)
Immediate Anesthesia Transfer of Care Note  Patient: Sheila Rojas  Procedure(s) Performed: [place] (Bilateral Eye)  Patient Location: PACU  Anesthesia Type: MAC  Level of Consciousness: awake, alert  and patient cooperative  Airway and Oxygen Therapy: Patient Spontanous Breathing and Patient connected to supplemental oxygen  Post-op Assessment: Post-op Vital signs reviewed, Patient's Cardiovascular Status Stable, Respiratory Function Stable, Patent Airway and No signs of Nausea or vomiting  Post-op Vital Signs: Reviewed and stable  Complications: No apparent anesthesia complications

## 2019-11-18 NOTE — Interval H&P Note (Signed)
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  The various methods of treatment have been discussed with the patient and family. After consideration of risks, benefits and other options for treatment, the patient has consented to  Procedure(s): [place] (Bilateral) as a surgical intervention.  The patient's history has been reviewed, patient examined, no change in status, stable for surgery.  I have reviewed the patient's chart and labs.  Questions were answered to the patient's satisfaction.     Vickki Muff, Javien Tesch M

## 2019-11-18 NOTE — Anesthesia Preprocedure Evaluation (Signed)
Anesthesia Evaluation  Patient identified by MRN, date of birth, ID band Patient awake    Reviewed: NPO status   History of Anesthesia Complications Negative for: history of anesthetic complications  Airway Mallampati: II  TM Distance: >3 FB Neck ROM: full    Dental no notable dental hx.    Pulmonary neg pulmonary ROS,    Pulmonary exam normal        Cardiovascular Exercise Tolerance: Good negative cardio ROS Normal cardiovascular exam     Neuro/Psych negative neurological ROS  negative psych ROS   GI/Hepatic negative GI ROS, Neg liver ROS,   Endo/Other  Morbid obesity (bmi 28)  Renal/GU negative Renal ROS  negative genitourinary   Musculoskeletal  (+) Arthritis ,   Abdominal   Peds  Hematology negative hematology ROS (+)   Anesthesia Other Findings   Covid: NEG.  Reproductive/Obstetrics                             Anesthesia Physical Anesthesia Plan  ASA: II  Anesthesia Plan: MAC   Post-op Pain Management:    Induction:   PONV Risk Score and Plan: 2 and TIVA and Propofol infusion  Airway Management Planned:   Additional Equipment:   Intra-op Plan:   Post-operative Plan:   Informed Consent: I have reviewed the patients History and Physical, chart, labs and discussed the procedure including the risks, benefits and alternatives for the proposed anesthesia with the patient or authorized representative who has indicated his/her understanding and acceptance.       Plan Discussed with: CRNA  Anesthesia Plan Comments:        Anesthesia Quick Evaluation

## 2019-11-18 NOTE — H&P (Signed)
See the history and physical completed at [facility] on 11/02/19 and scanned into the chart.

## 2019-11-18 NOTE — Op Note (Signed)
Preoperative Diagnosis:   1.  Visually significant bilateral brow ptosis.   Postoperative Diagnosis: Same.   Procedure(s) Performed:   1. bilateral  Direct brow lift to improve vision.   Surgeon: Philis Pique. Vickki Muff, M.D.   Assistants: None   Anesthesia: MAC  Specimens: None.  Estimated Blood Loss: Minimal.  Complications: None.  Operative Findings: None Dictated  PROCEDURE:   Allergies were reviewed and the patient is allergic to codeine; peanut butter flavor; shrimp [shellfish allergy]; ibuprofen; other; and sulfonamide derivatives..   After the risks, benefits, complications and alternatives were discussed with the patient, appropriate informed consent was obtained. While seated in an upright position and looking in primary gaze, the amount of supra-brow skin to be removed was measured and marked in an elliptical pattern. The patient was then brought to the operating suite and reclined supine.  Timeout was conducted and the patient was sedated. Local anesthetic consisting of a 50-50 mixture of 2% lidocaine with epinephrine and 0.75% bupivacaine with added Hylenex was injected subcutaneously to the bilateral  brow region(s) and down to the periosteum. After adequate local was instilled, the patient was prepped and draped in the usual sterile fashion for eyelid surgery.   Attention was turned to the right brow region. A #15 blade was used to create a bevelled incision along the premarked incision line. A skin and subcutaneous tissue flap was then excised and hemostasis was obtained with bipolar cautery. The deep tissues were reapproximated with interrupted vertical 5-0 chromic sutures. The skin margin was reapproximated with a running locking 5-0 Prolene suture. Attention was then turned to the opposite brow region where the same procedure was performed in the same manner.    The patient tolerated the procedure well. Erythromycin ophthalmic ointment was applied to the incision site(s)  followed by ice packs.The patient was taken to the recovery area where she recovered without difficulty.  Post-Op Plan/Instructions:  The patient was instructed to use ice packs frequently for the next 48 hours.  she was instructed to use OTC antibiotic ointment on the eyelid incisions and over-the-counter antibiotic ointment on the brow sutures 4 times a day for the next 12 to 14 days. she was given a prescription for Tramadol (or similar) for pain control should Tylenol not be effective. she was asked to to follow up in 10-12 days time for suture removal or sooner as needed for problems.   Willye Javier M. Vickki Muff, M.D. Ophthalmology

## 2019-11-21 ENCOUNTER — Encounter: Payer: Self-pay | Admitting: *Deleted

## 2019-12-12 DIAGNOSIS — E538 Deficiency of other specified B group vitamins: Secondary | ICD-10-CM | POA: Diagnosis not present

## 2020-01-11 DIAGNOSIS — E538 Deficiency of other specified B group vitamins: Secondary | ICD-10-CM | POA: Diagnosis not present

## 2020-01-24 DIAGNOSIS — S61412A Laceration without foreign body of left hand, initial encounter: Secondary | ICD-10-CM | POA: Diagnosis not present

## 2020-01-24 DIAGNOSIS — Z23 Encounter for immunization: Secondary | ICD-10-CM | POA: Diagnosis not present

## 2020-02-13 DIAGNOSIS — E538 Deficiency of other specified B group vitamins: Secondary | ICD-10-CM | POA: Diagnosis not present

## 2020-03-15 DIAGNOSIS — E538 Deficiency of other specified B group vitamins: Secondary | ICD-10-CM | POA: Diagnosis not present

## 2020-04-07 ENCOUNTER — Emergency Department: Payer: Medicare HMO

## 2020-04-07 ENCOUNTER — Emergency Department
Admission: EM | Admit: 2020-04-07 | Discharge: 2020-04-07 | Disposition: A | Discharge status: Home / Self Care | Payer: Medicare HMO | Attending: Emergency Medicine | Admitting: Emergency Medicine

## 2020-04-07 ENCOUNTER — Other Ambulatory Visit: Payer: Self-pay

## 2020-04-07 ENCOUNTER — Encounter: Payer: Self-pay | Admitting: Emergency Medicine

## 2020-04-07 DIAGNOSIS — M542 Cervicalgia: Secondary | ICD-10-CM | POA: Diagnosis not present

## 2020-04-07 DIAGNOSIS — M5412 Radiculopathy, cervical region: Secondary | ICD-10-CM | POA: Diagnosis not present

## 2020-04-07 DIAGNOSIS — R1013 Epigastric pain: Secondary | ICD-10-CM | POA: Diagnosis not present

## 2020-04-07 DIAGNOSIS — M549 Dorsalgia, unspecified: Secondary | ICD-10-CM | POA: Diagnosis not present

## 2020-04-07 LAB — URINALYSIS, COMPLETE (UACMP) WITH MICROSCOPIC
Bilirubin Urine: NEGATIVE
Glucose, UA: NEGATIVE mg/dL
Ketones, ur: 5 mg/dL — AB
Leukocytes,Ua: NEGATIVE
Nitrite: NEGATIVE
Protein, ur: 100 mg/dL — AB
Specific Gravity, Urine: 1.013 (ref 1.005–1.030)
pH: 7 (ref 5.0–8.0)

## 2020-04-07 LAB — CBC WITH DIFFERENTIAL/PLATELET
Abs Immature Granulocytes: 0.02 10*3/uL (ref 0.00–0.07)
Basophils Absolute: 0 10*3/uL (ref 0.0–0.1)
Basophils Relative: 0 %
Eosinophils Absolute: 0.1 10*3/uL (ref 0.0–0.5)
Eosinophils Relative: 1 %
HCT: 41.9 % (ref 36.0–46.0)
Hemoglobin: 14.7 g/dL (ref 12.0–15.0)
Immature Granulocytes: 0 %
Lymphocytes Relative: 25 %
Lymphs Abs: 1.9 10*3/uL (ref 0.7–4.0)
MCH: 31.1 pg (ref 26.0–34.0)
MCHC: 35.1 g/dL (ref 30.0–36.0)
MCV: 88.8 fL (ref 80.0–100.0)
Monocytes Absolute: 0.5 10*3/uL (ref 0.1–1.0)
Monocytes Relative: 7 %
Neutro Abs: 5.1 10*3/uL (ref 1.7–7.7)
Neutrophils Relative %: 67 %
Platelets: 227 10*3/uL (ref 150–400)
RBC: 4.72 MIL/uL (ref 3.87–5.11)
RDW: 13 % (ref 11.5–15.5)
WBC: 7.7 10*3/uL (ref 4.0–10.5)
nRBC: 0 % (ref 0.0–0.2)

## 2020-04-07 LAB — COMPREHENSIVE METABOLIC PANEL
ALT: 19 U/L (ref 0–44)
AST: 20 U/L (ref 15–41)
Albumin: 4.4 g/dL (ref 3.5–5.0)
Alkaline Phosphatase: 73 U/L (ref 38–126)
Anion gap: 8 (ref 5–15)
BUN: 13 mg/dL (ref 8–23)
CO2: 29 mmol/L (ref 22–32)
Calcium: 9.2 mg/dL (ref 8.9–10.3)
Chloride: 96 mmol/L — ABNORMAL LOW (ref 98–111)
Creatinine, Ser: 0.67 mg/dL (ref 0.44–1.00)
GFR, Estimated: >60 mL/min (ref >60)
Glucose, Bld: 114 mg/dL — ABNORMAL HIGH (ref 70–99)
Potassium: 3.8 mmol/L (ref 3.5–5.1)
Sodium: 133 mmol/L — ABNORMAL LOW (ref 135–145)
Total Bilirubin: 0.8 mg/dL (ref 0.3–1.2)
Total Protein: 8 g/dL (ref 6.5–8.1)

## 2020-04-07 LAB — TROPONIN I (HIGH SENSITIVITY): Troponin I (High Sensitivity): 8 ng/L (ref <18)

## 2020-04-07 LAB — LIPASE, BLOOD: Lipase: 31 U/L (ref 11–51)

## 2020-04-07 MED ORDER — LIDOCAINE 5 % EX PTCH: 1.0000 | MEDICATED_PATCH | Freq: Two times a day (BID) | CUTANEOUS | 0 refills | Status: AC

## 2020-04-07 MED ORDER — PREDNISONE 10 MG (21) PO TBPK: ORAL_TABLET | ORAL | 0 refills | Status: AC

## 2020-04-07 NOTE — ED Notes (Signed)
Dr. Goodman at bedside.  

## 2020-04-07 NOTE — Discharge Instructions (Signed)
Please seek medical attention for any high fevers, chest pain, shortness of breath, change in behavior, persistent vomiting, bloody stool or any other new or concerning symptoms.  

## 2020-04-07 NOTE — ED Provider Notes (Signed)
[facility] Emergency Department Provider Note    ____________________________________________   I have reviewed the triage vital signs and the nursing notes.   HISTORY  Chief Complaint Neck pain  History limited by: Not Limited   HPI Sheila Rojas is a 79 y.o. female who presents to the emergency department today with primary complaint of neck pain.  The patient states that she started having neck pain yesterday.  This was then accompanied by bilateral arm pain left worse than right.  She denies any weakness or numbness in her arms.  At one point the pain did seem to go down to her epigastric region however this is since resolved.  She denies any nausea or vomiting.  She denies any shortness of breath.  Denies any falls or trauma to her neck.  She states that she was picking up 50 pound bags of deer corn although this is not unusual for her.  Patient denies similar pain in the past.   Records reviewed. Per medical record review patient has a history of arthritis.   Past Medical History:  Diagnosis Date  . Arthritis     Patient Active Problem List   Diagnosis Date Noted  . ALLERGIC RHINITIS 02/11/2008  . COUGH 02/11/2008    Past Surgical History:  Procedure Laterality Date  . BLEPHAROPLASTY    . BREAST CYST ASPIRATION Left   . FOOT SURGERY    . KIDNEY SURGERY     around age 53  . PTOSIS REPAIR Bilateral 11/18/2019   Procedure: BROW PTOSIS REPAIR;  Surgeon: Karle Starch, MD;  Location: [facility];  Service: Ophthalmology;  Laterality: Bilateral;    Prior to Admission medications   Not on File    Allergies Codeine, Peanut butter flavor, Shrimp [shellfish allergy], Ibuprofen, Other, and Sulfonamide derivatives  Family History  Problem Relation Age of Onset  . Breast cancer Sister        half sister    Social History Social History   Tobacco Use  . Smoking status: Never Smoker  . Smokeless tobacco: Never Used  Vaping  Use  . Vaping Use: Never used  Substance Use Topics  . Alcohol use: Never  . Drug use: Not on file    Review of Systems Constitutional: No fever/chills Eyes: No visual changes. ENT: No sore throat. Cardiovascular: Denies chest pain. Respiratory: Denies shortness of breath. Gastrointestinal: Positive for abdominal pain.  No nausea, no vomiting.  No diarrhea.   Genitourinary: Negative for dysuria. Musculoskeletal: Positive for neck and bilateral arm pain, left greater than right. Skin: Negative for rash. Neurological: Negative for headaches, focal weakness or numbness.  ____________________________________________   PHYSICAL EXAM:  VITAL SIGNS: ED Triage Vitals  Enc Vitals Group     BP 04/07/20 1148 (!) 178/82     Pulse Rate 04/07/20 1148 74     Resp 04/07/20 1148 16     Temp 04/07/20 1148 98.1 F (36.7 C)     Temp Source 04/07/20 1148 Oral     SpO2 04/07/20 1148 97 %     Weight 04/07/20 1148 150 lb (68 kg)     Height 04/07/20 1148 5' (1.524 m)     Head Circumference --      Peak Flow --      Pain Score 04/07/20 1157 8   Constitutional: Alert and oriented.  Eyes: Conjunctivae are normal.  ENT      Head: Normocephalic and atraumatic.      Nose: No  congestion/rhinnorhea.      Mouth/Throat: Mucous membranes are moist.      Neck: No stridor. No midline tenderness.  Hematological/Lymphatic/Immunilogical: No cervical lymphadenopathy. Cardiovascular: Normal rate, regular rhythm.  No murmurs, rubs, or gallops.  Respiratory: Normal respiratory effort without tachypnea nor retractions. Breath sounds are clear and equal bilaterally. No wheezes/rales/rhonchi. Gastrointestinal: Soft and non tender. No rebound. No guarding.  Genitourinary: Deferred Musculoskeletal: Normal range of motion in all extremities. No lower extremity edema. Neurologic:  Normal speech and language. No gross focal neurologic deficits are appreciated.  Skin:  Skin is warm, dry and intact. No rash  noted. Psychiatric: Mood and affect are normal. Speech and behavior are normal. Patient exhibits appropriate insight and judgment.  ____________________________________________    LABS (pertinent positives/negatives)  Lipase 31 Trop hs 8 UA clear, moderate hgb dipstick, protein 100, 6-10 RBC, 0-5 WBC CMP na 133, cl 96, glu 114, otherwise wnl CBC wbc 7.7, hgb 14.7, plt 227  ____________________________________________   EKG  I, Nance Pear, attending physician, personally viewed and interpreted this EKG  EKG Time: 1157 Rate: 75 Rhythm: normal sinus rhythm Axis: left axis deviation Intervals: qtc 431 QRS: narrow ST changes: no st elevation Impression: abnormal ekg  ____________________________________________    RADIOLOGY  CXR No active cardiopulmonary disease  CT cervical spine No acute abnormity. Degenerative disc disease. ____________________________________________   PROCEDURES  Procedures  ____________________________________________   INITIAL IMPRESSION / ASSESSMENT AND PLAN / ED COURSE  Pertinent labs & imaging results that were available during my care of the patient were reviewed by me and considered in my medical decision making (see chart for details).   Patient presented to the emergency department today because of concerns for neck pain with radiation to her arms. Patient's blood work here without concerning findings. Given patient's description of pain I did have concern for cervical radiculopathy. CT scan was obtained to evaluate for any possible pathologic fractures. While this did not show any acute fractures is consistent with degenerative disc disease. Discussed this with the patient. This time I have low suspicion for cardiac or vascular etiology of the patient's pain. Will plan on discharging with prednisone and lidocaine patches.  ____________________________________________   FINAL CLINICAL IMPRESSION(S) / ED DIAGNOSES  Final  diagnoses:  Cervical radiculopathy     Note: This dictation was prepared with Dragon dictation. Any transcriptional errors that result from this process are unintentional     Nance Pear, MD 04/07/20 1942

## 2020-04-07 NOTE — ED Notes (Signed)
Pt given water 

## 2020-04-07 NOTE — ED Triage Notes (Signed)
Pt presents to ED via POV with c/o upper back pain that started last night, pt states put some "vick's vapo rub" for some relief. Pt states today has pain running down L arm. Pt also c/o generalized abdominal pain at this time. Pt denies N/V/D with the abdominal pain. Pt A&O x4, skin warm, dry, and intact in triage.

## 2020-04-16 DIAGNOSIS — E538 Deficiency of other specified B group vitamins: Secondary | ICD-10-CM | POA: Diagnosis not present

## 2020-05-14 DIAGNOSIS — E538 Deficiency of other specified B group vitamins: Secondary | ICD-10-CM | POA: Diagnosis not present

## 2020-05-16 DIAGNOSIS — H2513 Age-related nuclear cataract, bilateral: Secondary | ICD-10-CM | POA: Diagnosis not present

## 2020-05-22 ENCOUNTER — Other Ambulatory Visit: Payer: Self-pay | Admitting: Family Medicine

## 2020-05-22 DIAGNOSIS — Z1231 Encounter for screening mammogram for malignant neoplasm of breast: Secondary | ICD-10-CM

## 2020-06-05 DIAGNOSIS — Z0000 Encounter for general adult medical examination without abnormal findings: Secondary | ICD-10-CM | POA: Diagnosis not present

## 2020-06-05 DIAGNOSIS — E785 Hyperlipidemia, unspecified: Secondary | ICD-10-CM | POA: Diagnosis not present

## 2020-06-05 DIAGNOSIS — M79641 Pain in right hand: Secondary | ICD-10-CM | POA: Diagnosis not present

## 2020-06-05 DIAGNOSIS — M79642 Pain in left hand: Secondary | ICD-10-CM | POA: Diagnosis not present

## 2020-06-05 DIAGNOSIS — E538 Deficiency of other specified B group vitamins: Secondary | ICD-10-CM | POA: Diagnosis not present

## 2020-06-11 DIAGNOSIS — E538 Deficiency of other specified B group vitamins: Secondary | ICD-10-CM | POA: Diagnosis not present

## 2020-07-09 DIAGNOSIS — E538 Deficiency of other specified B group vitamins: Secondary | ICD-10-CM | POA: Diagnosis not present

## 2020-07-12 DIAGNOSIS — L814 Other melanin hyperpigmentation: Secondary | ICD-10-CM | POA: Diagnosis not present

## 2020-07-12 DIAGNOSIS — L821 Other seborrheic keratosis: Secondary | ICD-10-CM | POA: Diagnosis not present

## 2020-07-12 DIAGNOSIS — D225 Melanocytic nevi of trunk: Secondary | ICD-10-CM | POA: Diagnosis not present

## 2020-07-12 DIAGNOSIS — D1801 Hemangioma of skin and subcutaneous tissue: Secondary | ICD-10-CM | POA: Diagnosis not present

## 2020-07-12 DIAGNOSIS — L820 Inflamed seborrheic keratosis: Secondary | ICD-10-CM | POA: Diagnosis not present

## 2020-08-13 DIAGNOSIS — E538 Deficiency of other specified B group vitamins: Secondary | ICD-10-CM | POA: Diagnosis not present

## 2020-08-15 ENCOUNTER — Other Ambulatory Visit: Payer: Self-pay

## 2020-08-15 ENCOUNTER — Ambulatory Visit
Admission: RE | Admit: 2020-08-15 | Discharge: 2020-08-15 | Disposition: A | Discharge status: Home / Self Care | Payer: Medicare HMO | Source: Ambulatory Visit | Attending: Family Medicine | Admitting: Family Medicine

## 2020-08-15 DIAGNOSIS — Z1231 Encounter for screening mammogram for malignant neoplasm of breast: Secondary | ICD-10-CM | POA: Insufficient documentation

## 2020-08-16 DIAGNOSIS — M545 Low back pain, unspecified: Secondary | ICD-10-CM | POA: Diagnosis not present

## 2020-08-16 DIAGNOSIS — M542 Cervicalgia: Secondary | ICD-10-CM | POA: Diagnosis not present

## 2020-08-20 DIAGNOSIS — M79644 Pain in right finger(s): Secondary | ICD-10-CM | POA: Diagnosis not present

## 2020-09-10 DIAGNOSIS — E538 Deficiency of other specified B group vitamins: Secondary | ICD-10-CM | POA: Diagnosis not present

## 2020-10-08 DIAGNOSIS — E538 Deficiency of other specified B group vitamins: Secondary | ICD-10-CM | POA: Diagnosis not present

## 2020-10-24 ENCOUNTER — Ambulatory Visit: Payer: Medicare HMO | Admitting: Dermatology

## 2020-11-05 DIAGNOSIS — E538 Deficiency of other specified B group vitamins: Secondary | ICD-10-CM | POA: Diagnosis not present

## 2020-12-10 DIAGNOSIS — E538 Deficiency of other specified B group vitamins: Secondary | ICD-10-CM | POA: Diagnosis not present

## 2021-01-07 DIAGNOSIS — E538 Deficiency of other specified B group vitamins: Secondary | ICD-10-CM | POA: Diagnosis not present

## 2021-01-29 DIAGNOSIS — E538 Deficiency of other specified B group vitamins: Secondary | ICD-10-CM | POA: Diagnosis not present

## 2022-09-11 IMAGING — CR DG CHEST 2V
1 series · 2 of 2 positions shown · non-contrast
Comparison: None.

CLINICAL DATA: Back pain and left arm pain since last night.

EXAM:
CHEST - 2 VIEW

[Series 1: dg chest 2 view · 0.14mm/px · 2 of 2 slices shown]
[im 1/2]
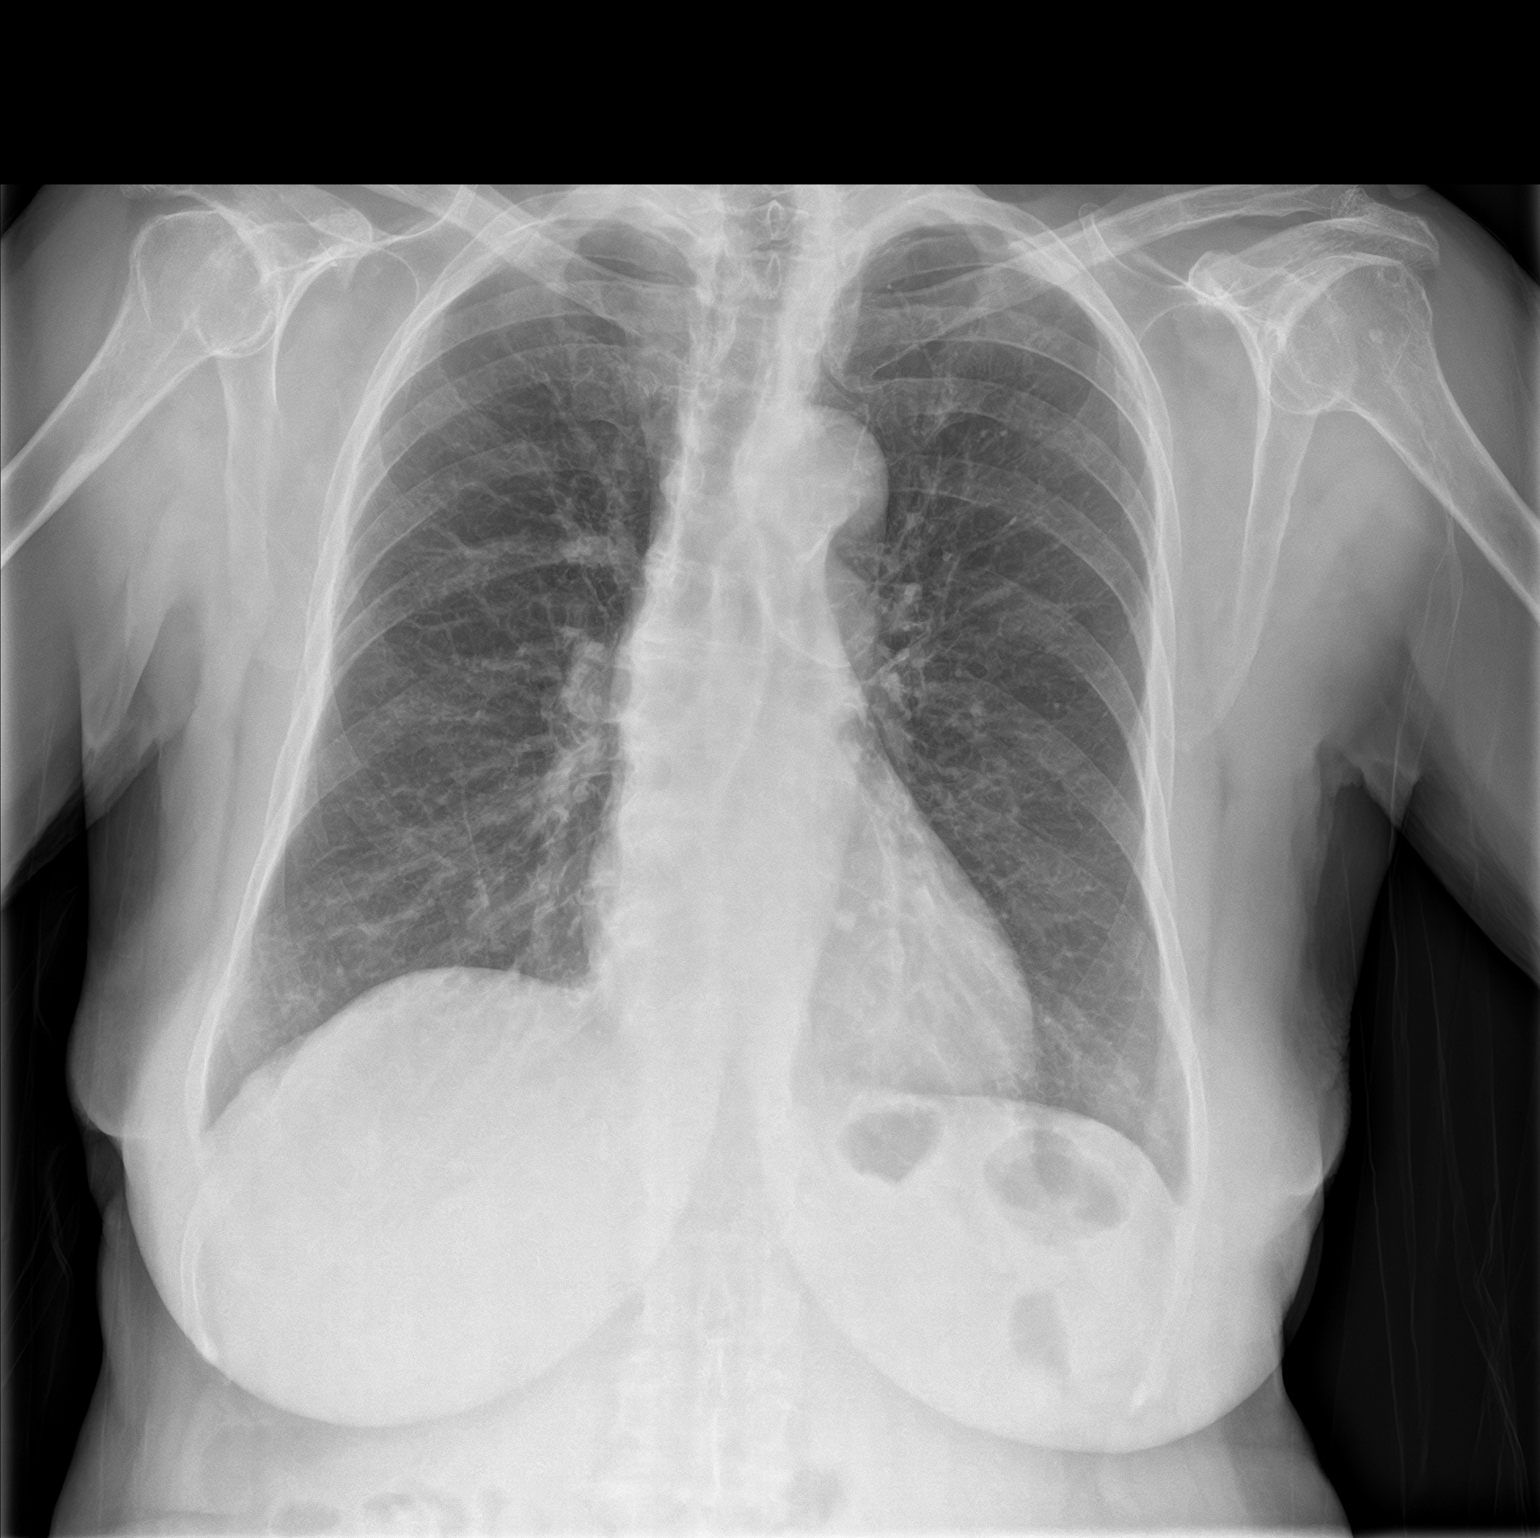
[im 2/2]
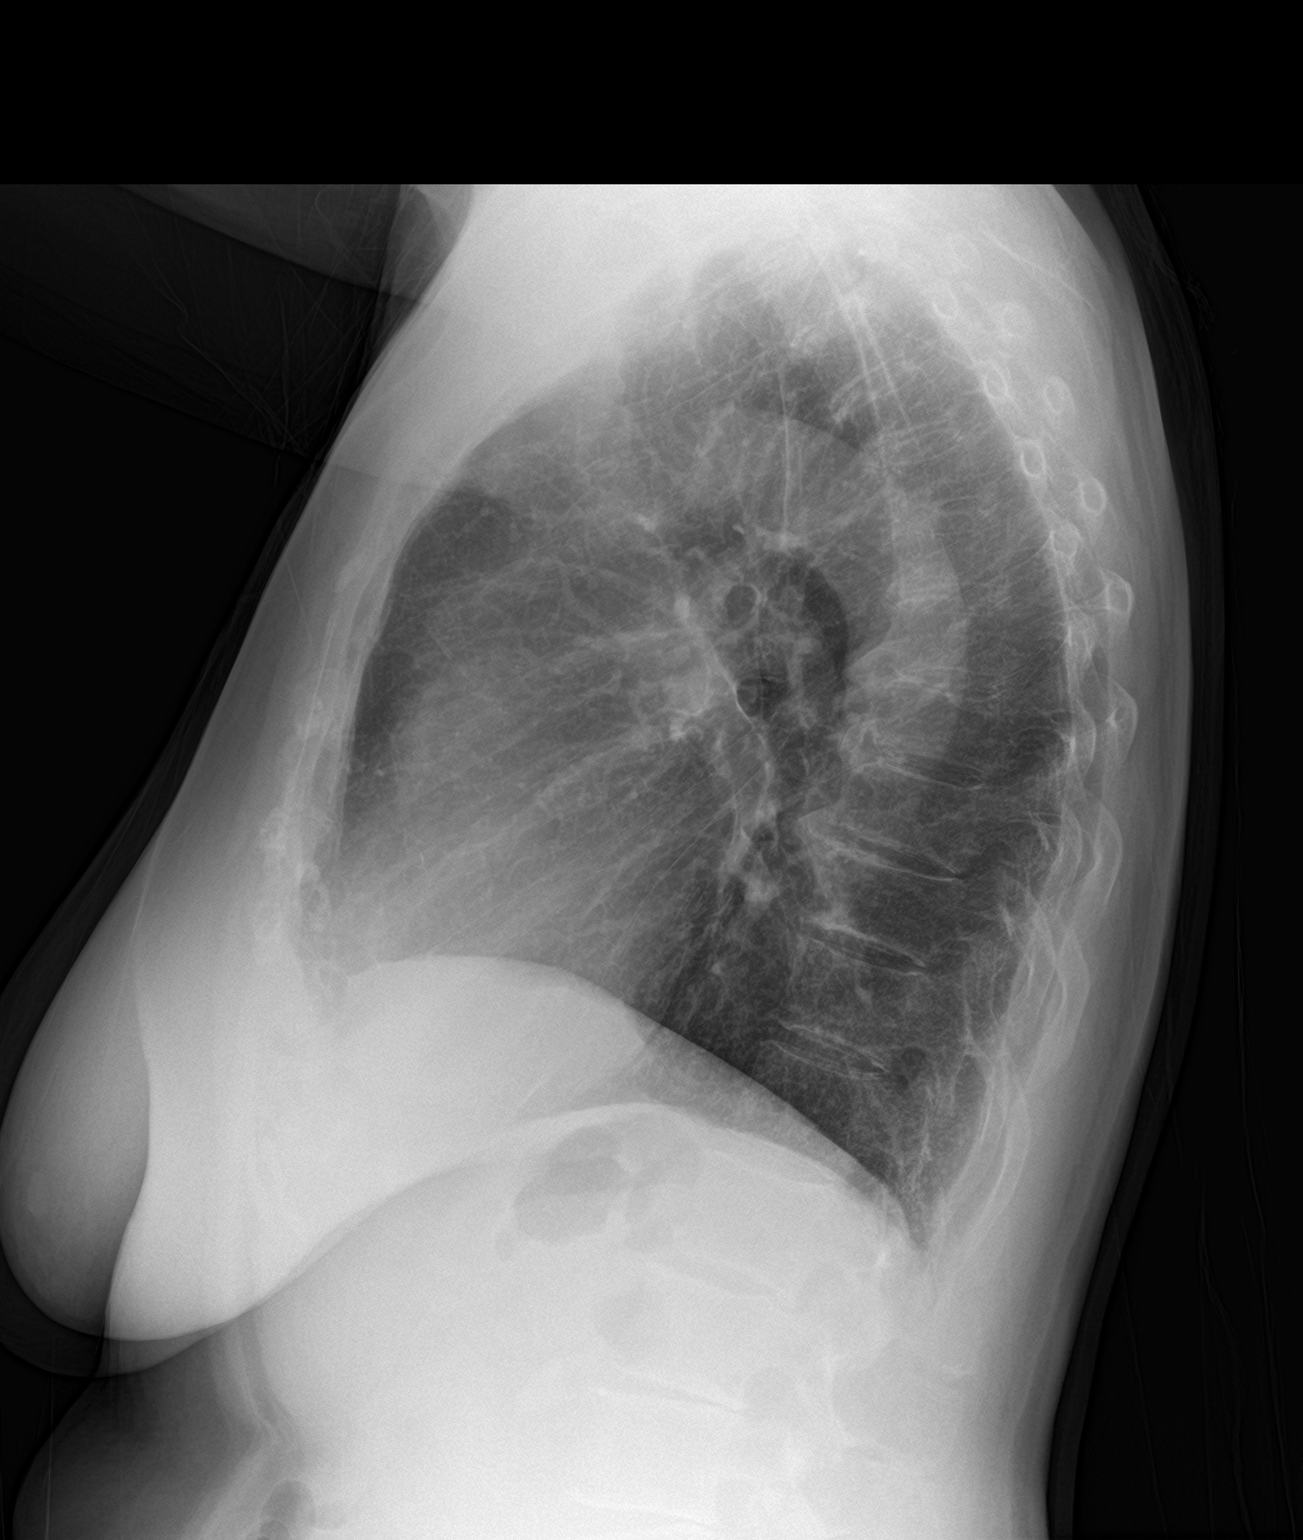

[2 of 2 positions shown; findings below may reference images not displayed]

FINDINGS: The heart size and mediastinal contours are within normal limits.
Both lungs are clear. Degenerative joint changes of the spine are
noted.
IMPRESSION: No active cardiopulmonary disease.

## 2022-09-11 IMAGING — CT CT CERVICAL SPINE W/O CM
3 of 4 series · 11 of 33 positions shown, 13 images · non-contrast
Comparison: None.

CLINICAL DATA: Patient with left upper extremity neck pain.

EXAM:
CT CERVICAL SPINE WITHOUT CONTRAST
TECHNIQUE: Multidetector CT imaging of the cervical spine was performed without
intravenous contrast. Multiplanar CT image reconstructions were also
generated.

[Series 4: sagittal bone · sagittal · 0.23mm/px · 5 of 61 slices shown, 6 images]
[im 21/61  bone]
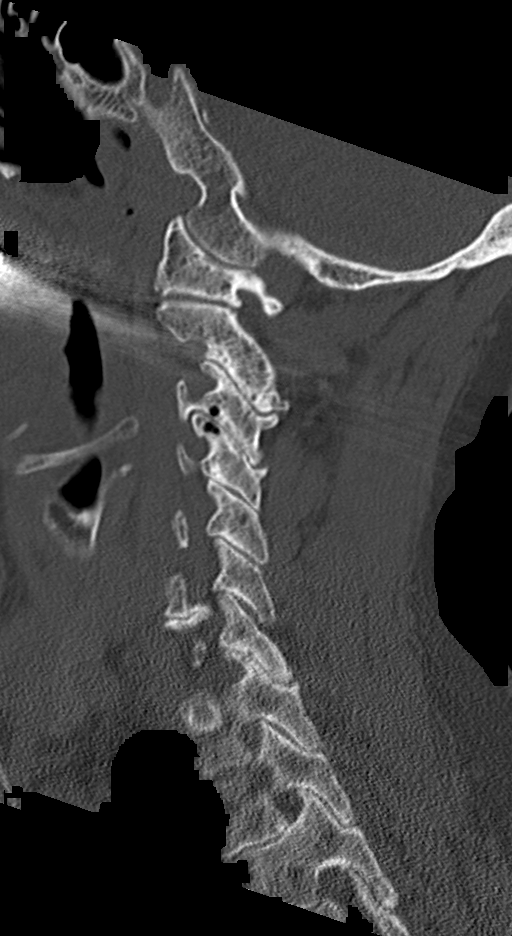
[im 26/61  bone]
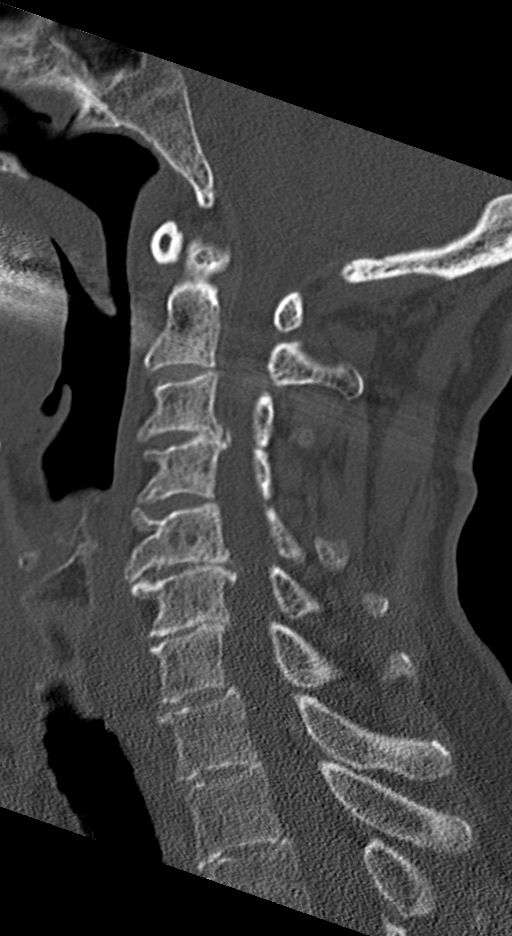
[im 31/61  soft-tissue]
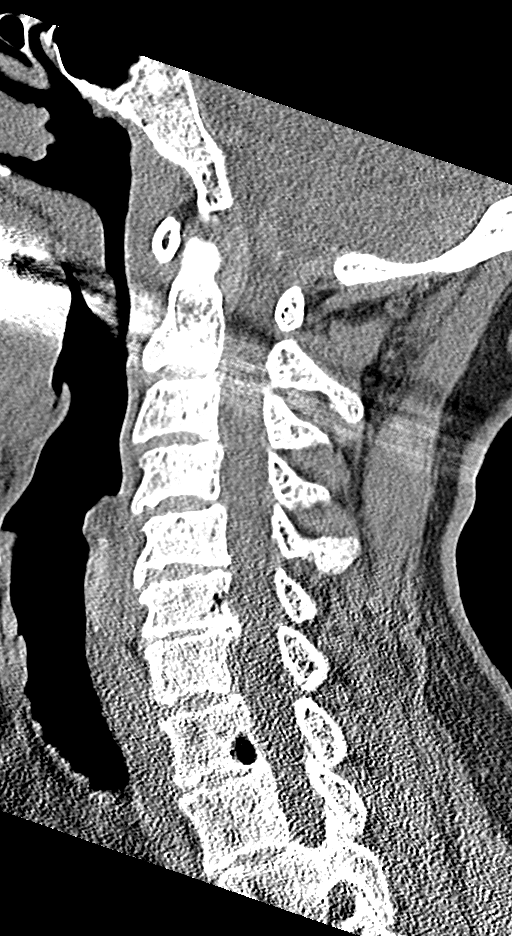
[im 31/61  bone]
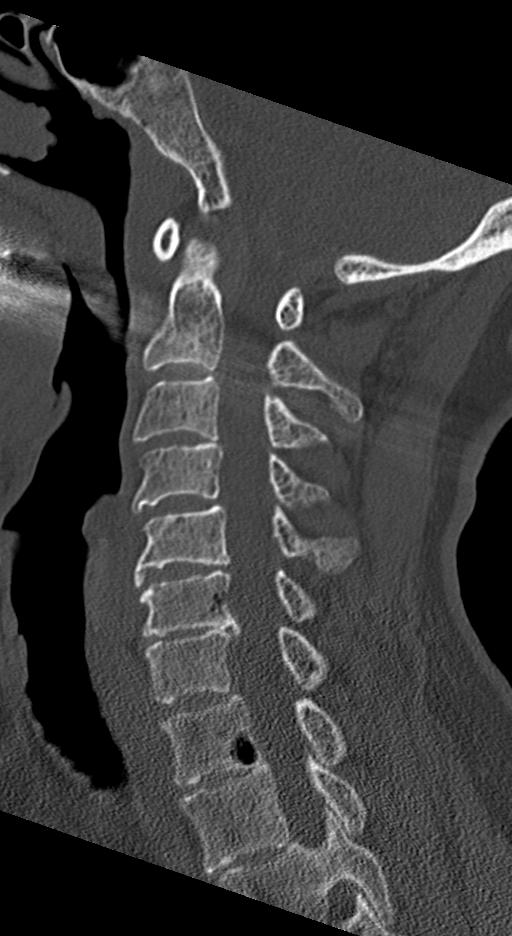
[im 36/61  bone]
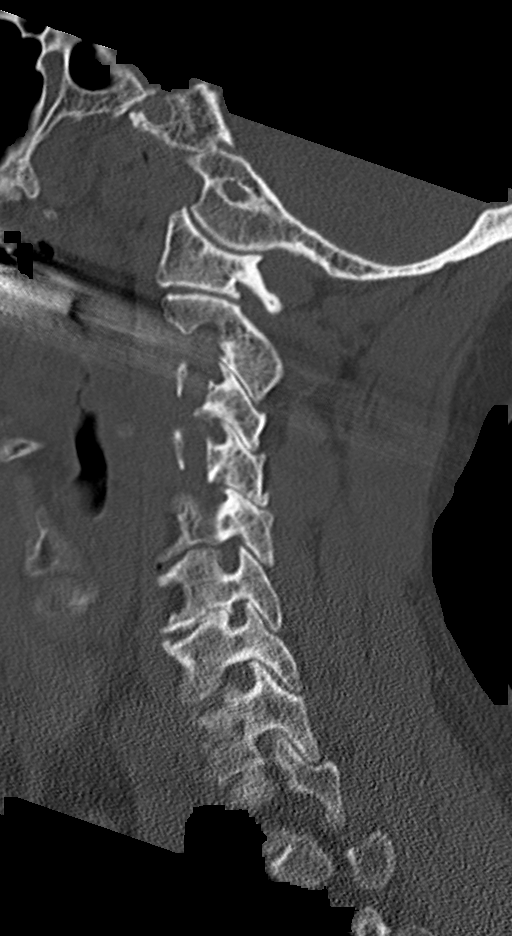
[im 41/61  bone]
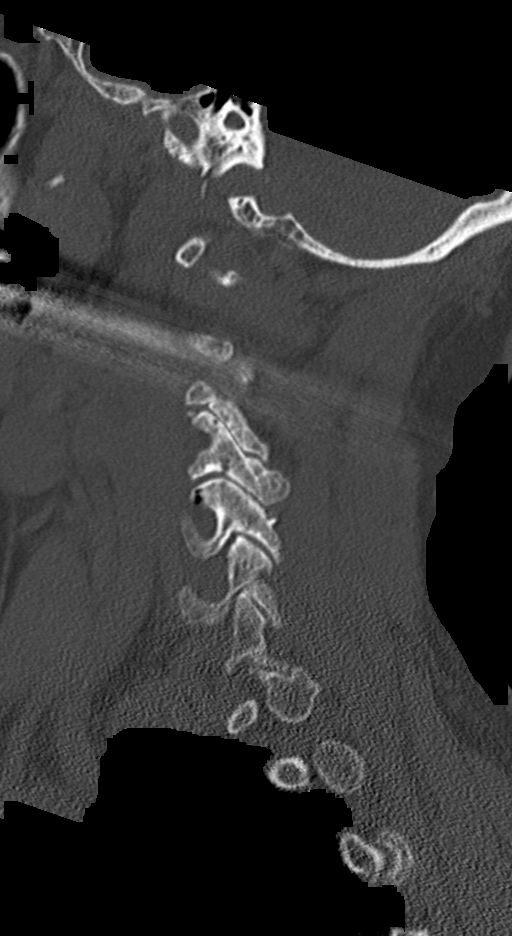

[Series 5: coronal bone · coronal · 0.24mm/px · 3 of 59 slices shown]
[im 12/59  bone]
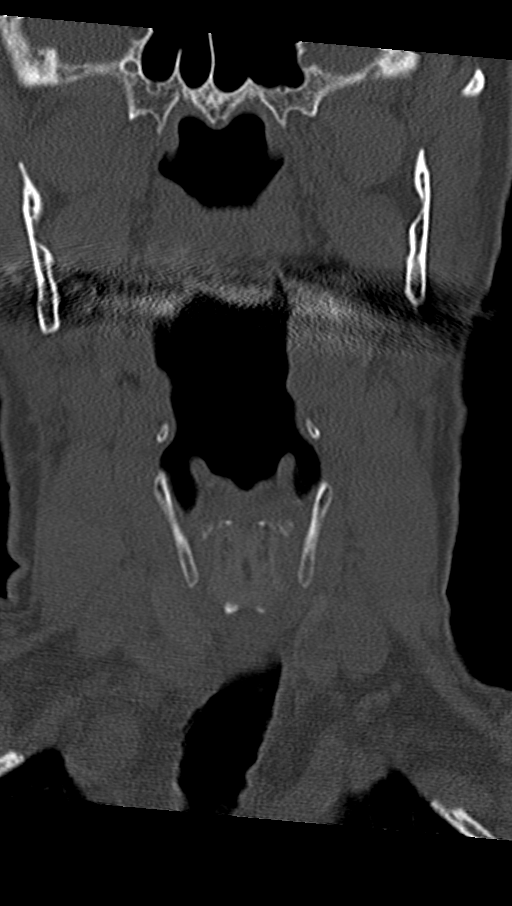
[im 24/59  bone]
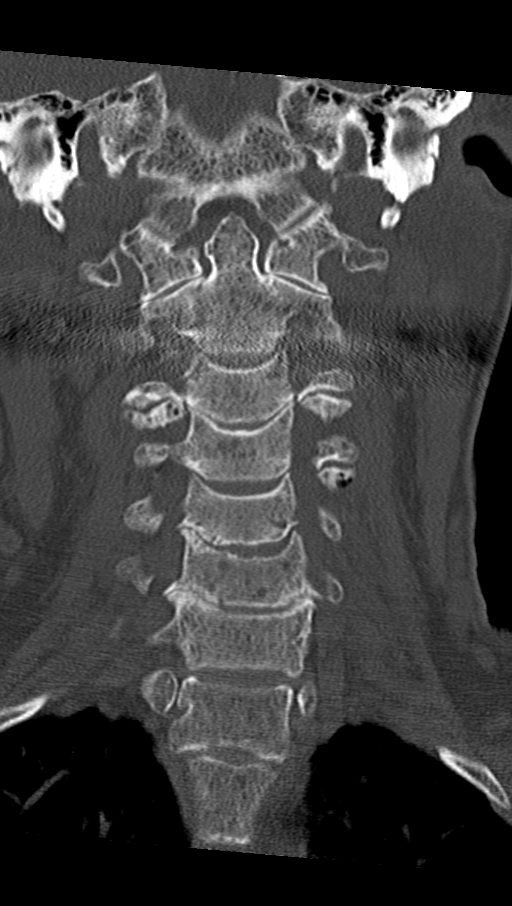
[im 35/59  bone]
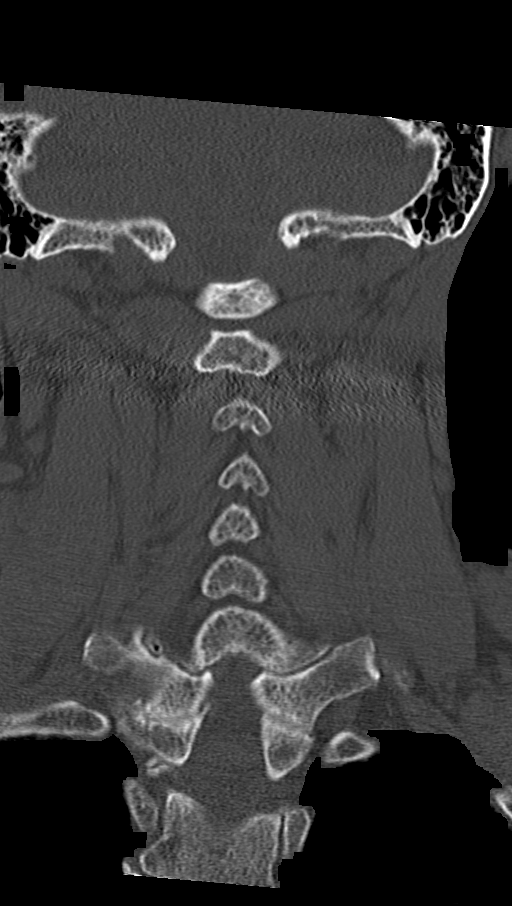

[Series 6: orthogonal bone · axial · 0.23mm/px · z∈[-218,-103]mm · 3 of 108 slices shown, 4 images]
[im 31/108  soft-tissue]
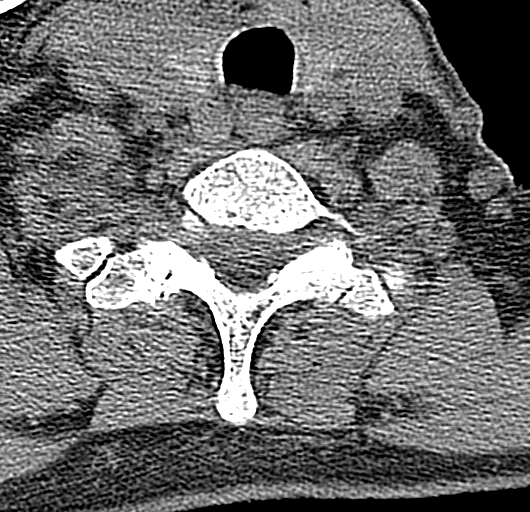
[im 31/108  bone]
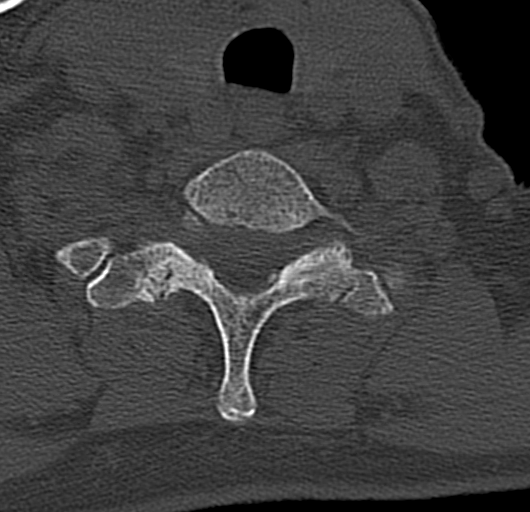
[im 62/108  bone]
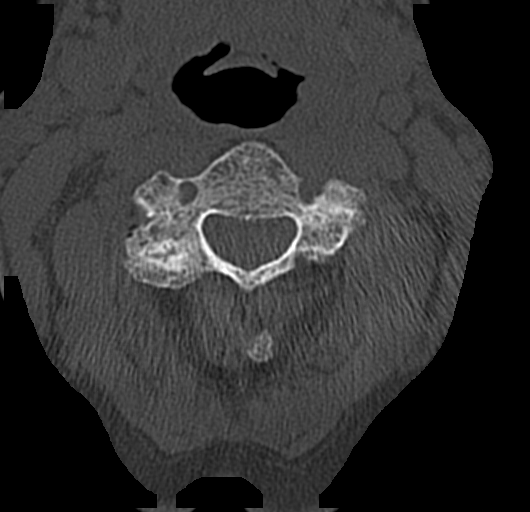
[im 92/108  bone]
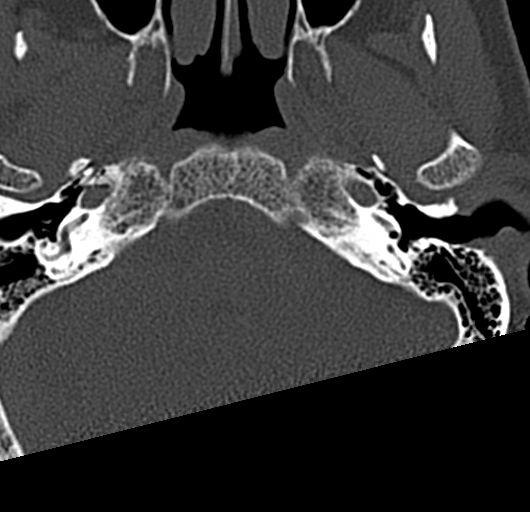

[11 of 33 positions shown; findings below may reference images not displayed]

FINDINGS: Alignment: Normal.

Skull base and vertebrae: No acute fracture. No primary bone lesion
or focal pathologic process.

Soft tissues and spinal canal: No prevertebral fluid or swelling. No
visible canal hematoma.

Disc levels: Multilevel degenerative disc disease most pronounced
C5-6 and C6-7. Posterior disc osteophyte complex at the C5-6 level8
narrows the canal. Right C3-4 uncovertebral hypertrophy causes
foraminal narrowing. Multilevel facet degenerative changes most
pronounced right C3-4 and left C4-5.

Upper chest: Unremarkable.

Other: None.
IMPRESSION: 1. No acute cervical spine fracture.
2. Multilevel degenerative disc disease.
